# Patient Record
Sex: Female | Born: 1985
Health system: Southern US, Community
[De-identification: ages and names within clinical notes are randomized; demographics above are authoritative.]

## PROBLEM LIST (undated history)

## (undated) DIAGNOSIS — E079 Disorder of thyroid, unspecified: Secondary | ICD-10-CM

## (undated) DIAGNOSIS — N649 Disorder of breast, unspecified: Secondary | ICD-10-CM

## (undated) HISTORY — PX: BREAST SURGERY: SHX581

## (undated) HISTORY — DX: Disorder of breast, unspecified: N64.9

---

## 2006-09-23 ENCOUNTER — Emergency Department (HOSPITAL_COMMUNITY): Admission: EM | Admit: 2006-09-23 | Discharge: 2006-09-23 | Payer: Self-pay | Admitting: Emergency Medicine

## 2006-09-25 ENCOUNTER — Emergency Department (HOSPITAL_COMMUNITY): Admission: EM | Admit: 2006-09-25 | Discharge: 2006-09-25 | Payer: Self-pay | Admitting: Emergency Medicine

## 2009-01-03 ENCOUNTER — Emergency Department (HOSPITAL_COMMUNITY): Admission: EM | Admit: 2009-01-03 | Discharge: 2009-01-03 | Payer: Self-pay | Admitting: Emergency Medicine

## 2009-01-06 ENCOUNTER — Emergency Department (HOSPITAL_COMMUNITY): Admission: EM | Admit: 2009-01-06 | Discharge: 2009-01-06 | Payer: Self-pay | Admitting: Family Medicine

## 2009-11-20 ENCOUNTER — Emergency Department (HOSPITAL_COMMUNITY): Admission: EM | Admit: 2009-11-20 | Discharge: 2009-11-21 | Payer: Self-pay | Admitting: Emergency Medicine

## 2010-05-04 ENCOUNTER — Emergency Department (HOSPITAL_COMMUNITY): Admission: EM | Admit: 2010-05-04 | Discharge: 2010-05-04 | Payer: Self-pay | Admitting: Emergency Medicine

## 2010-05-08 ENCOUNTER — Emergency Department (HOSPITAL_COMMUNITY): Admission: EM | Admit: 2010-05-08 | Discharge: 2010-05-08 | Payer: Self-pay | Admitting: Emergency Medicine

## 2010-10-12 ENCOUNTER — Emergency Department (HOSPITAL_COMMUNITY)
Admission: EM | Admit: 2010-10-12 | Discharge: 2010-10-13 | Payer: Self-pay | Source: Home / Self Care | Admitting: Emergency Medicine

## 2010-10-15 ENCOUNTER — Emergency Department (HOSPITAL_COMMUNITY)
Admission: EM | Admit: 2010-10-15 | Discharge: 2010-10-15 | Payer: Self-pay | Source: Home / Self Care | Admitting: Emergency Medicine

## 2012-03-10 ENCOUNTER — Ambulatory Visit (INDEPENDENT_AMBULATORY_CARE_PROVIDER_SITE_OTHER): Payer: Self-pay | Admitting: *Deleted

## 2012-03-10 ENCOUNTER — Other Ambulatory Visit: Payer: Self-pay | Admitting: Obstetrics and Gynecology

## 2012-03-10 VITALS — BP 129/87 | HR 89 | Temp 99.1°F | Ht 60.0 in | Wt 140.7 lb

## 2012-03-10 DIAGNOSIS — Z1239 Encounter for other screening for malignant neoplasm of breast: Secondary | ICD-10-CM

## 2012-03-10 DIAGNOSIS — N6310 Unspecified lump in the right breast, unspecified quadrant: Secondary | ICD-10-CM

## 2012-03-10 DIAGNOSIS — N63 Unspecified lump in unspecified breast: Secondary | ICD-10-CM

## 2012-03-10 NOTE — Patient Instructions (Signed)
Taught patient how to perform BSE. Patient did not need a Pap smear today due to last Pap smear was 02/07/12. Let her know BCCCP will cover Pap smears every 3 years unless has a history of abnormal Pap smears. Patient referred to the [facility] for right breast ultrasound. Appointment scheduled for Thursday, Mar 12, 2012 at 0930. Patient aware of appointment and will be there. Let patient know will follow up with her within the next couple weeks with results. Patient verbalized understanding.

## 2012-03-10 NOTE — Progress Notes (Signed)
Complaints of lump in right breast. Patient referred from the [facility] Department.  Pap Smear:    Pap smear not performed today. Patients last Pap smear was 02/07/12 at the [facility] Department and was normal showing fungus. Per patient she has no history of abnormal Pap smears. No Pap smear results in EPIC.   Physical exam: Breasts Breasts symmetrical. No skin abnormalities bilateral breasts. No nipple retraction bilateral breasts. No nipple discharge bilateral breasts. No lymphadenopathy. No lumps palpated left breast. Palpated lump in the right breast around around 12:30 o'clock 8 cm from the areola. No complaints of pain or tenderness on exam. Patient referred to the [facility] for right breast ultrasound. Appointment scheduled for Thursday, Mar 12, 2012 at 0930.     Pelvic/Bimanual No Pap smear completed today since last Pap smear was 02/07/12. Pap smear not indicated per BCCCP guidelines.

## 2012-03-12 ENCOUNTER — Other Ambulatory Visit: Payer: Self-pay | Admitting: Obstetrics and Gynecology

## 2012-03-12 ENCOUNTER — Ambulatory Visit
Admission: RE | Admit: 2012-03-12 | Discharge: 2012-03-12 | Disposition: A | Discharge status: Home / Self Care | Payer: No Typology Code available for payment source | Source: Ambulatory Visit | Attending: Obstetrics and Gynecology | Admitting: Obstetrics and Gynecology

## 2012-03-12 DIAGNOSIS — N63 Unspecified lump in unspecified breast: Secondary | ICD-10-CM

## 2012-03-12 DIAGNOSIS — N6310 Unspecified lump in the right breast, unspecified quadrant: Secondary | ICD-10-CM

## 2012-03-16 ENCOUNTER — Ambulatory Visit
Admission: RE | Admit: 2012-03-16 | Discharge: 2012-03-16 | Disposition: A | Discharge status: Home / Self Care | Payer: No Typology Code available for payment source | Source: Ambulatory Visit | Attending: Obstetrics and Gynecology | Admitting: Obstetrics and Gynecology

## 2012-03-16 ENCOUNTER — Other Ambulatory Visit: Payer: Self-pay | Admitting: Obstetrics and Gynecology

## 2012-03-16 DIAGNOSIS — N6310 Unspecified lump in the right breast, unspecified quadrant: Secondary | ICD-10-CM

## 2012-03-27 ENCOUNTER — Telehealth: Payer: Self-pay | Admitting: *Deleted

## 2012-03-27 NOTE — Telephone Encounter (Signed)
Telephone patient at home number left message to return call [phone].

## 2012-04-11 ENCOUNTER — Inpatient Hospital Stay (HOSPITAL_COMMUNITY)
Admission: EM | Admit: 2012-04-11 | Discharge: 2012-04-13 | DRG: 897 | Disposition: A | Discharge status: Home / Self Care | Payer: Self-pay | Attending: Internal Medicine | Admitting: Internal Medicine

## 2012-04-11 DIAGNOSIS — E876 Hypokalemia: Secondary | ICD-10-CM | POA: Diagnosis present

## 2012-04-11 DIAGNOSIS — F17200 Nicotine dependence, unspecified, uncomplicated: Secondary | ICD-10-CM | POA: Diagnosis present

## 2012-04-11 DIAGNOSIS — F10929 Alcohol use, unspecified with intoxication, unspecified: Secondary | ICD-10-CM

## 2012-04-11 DIAGNOSIS — F1010 Alcohol abuse, uncomplicated: Principal | ICD-10-CM | POA: Diagnosis present

## 2012-04-11 DIAGNOSIS — R4182 Altered mental status, unspecified: Secondary | ICD-10-CM | POA: Diagnosis present

## 2012-04-11 LAB — URINE MICROSCOPIC-ADD ON

## 2012-04-11 LAB — CBC
Hemoglobin: 10.9 g/dL — ABNORMAL LOW (ref 12.0–15.0)
MCH: 35 pg — ABNORMAL HIGH (ref 26.0–34.0)
MCV: 104.2 fL — ABNORMAL HIGH (ref 78.0–100.0)
RBC: 3.11 MIL/uL — ABNORMAL LOW (ref 3.87–5.11)
WBC: 5.6 10*3/uL (ref 4.0–10.5)

## 2012-04-11 LAB — COMPREHENSIVE METABOLIC PANEL
ALT: 7 U/L (ref 0–35)
Alkaline Phosphatase: 41 U/L (ref 39–117)
Calcium: 8.1 mg/dL — ABNORMAL LOW (ref 8.4–10.5)
GFR calc Af Amer: >90 mL/min (ref >90)
GFR calc non Af Amer: >90 mL/min (ref >90)
Glucose, Bld: 74 mg/dL (ref 70–99)
Potassium: 3.2 mEq/L — ABNORMAL LOW (ref 3.5–5.1)
Sodium: 142 mEq/L (ref 135–145)
Total Protein: 6.5 g/dL (ref 6.0–8.3)

## 2012-04-11 LAB — ACETAMINOPHEN LEVEL: Acetaminophen (Tylenol), Serum: <15 ug/mL (ref 10–30)

## 2012-04-11 LAB — URINALYSIS, ROUTINE W REFLEX MICROSCOPIC
Bilirubin Urine: NEGATIVE
Ketones, ur: NEGATIVE mg/dL
Nitrite: NEGATIVE
Protein, ur: NEGATIVE mg/dL
Urobilinogen, UA: 0.2 mg/dL (ref 0.0–1.0)
pH: 7 (ref 5.0–8.0)

## 2012-04-11 LAB — RAPID URINE DRUG SCREEN, HOSP PERFORMED
Amphetamines: NOT DETECTED
Barbiturates: NOT DETECTED
Opiates: NOT DETECTED

## 2012-04-11 LAB — ETHANOL: Alcohol, Ethyl (B): 190 mg/dL — ABNORMAL HIGH (ref 0–11)

## 2012-04-11 LAB — SALICYLATE LEVEL: Salicylate Lvl: <2 mg/dL — ABNORMAL LOW (ref 2.8–20.0)

## 2012-04-11 LAB — GLUCOSE, CAPILLARY

## 2012-04-11 MED ORDER — NALOXONE HCL 1 MG/ML IJ SOLN
INTRAMUSCULAR | Status: AC
  Administered 2012-04-11: 4 mg
  Filled 2012-04-11: qty 6

## 2012-04-11 MED ORDER — ONDANSETRON HCL 4 MG/2ML IJ SOLN
4.0000 mg | Freq: Once | INTRAMUSCULAR | Status: AC
  Administered 2012-04-11: 4 mg via INTRAVENOUS
  Filled 2012-04-11: qty 2

## 2012-04-11 MED ORDER — SODIUM CHLORIDE 0.9 % IV SOLN
INTRAVENOUS | Status: DC
  Administered 2012-04-11: 23:00:00 via INTRAVENOUS
  Administered 2012-04-12: 125 mL/h via INTRAVENOUS

## 2012-04-11 NOTE — ED Notes (Signed)
EKG printed and given to EDP Dierdre Highman for review. No previous available.

## 2012-04-11 NOTE — ED Notes (Signed)
Pt with binge drinking (at least 4 40 oz beers) and "she took some Vic[place]" unknown qty. Pt was unresponsive on EMS arrival. Pt had previously vomited prior to loss of consciousness. Had nasal trumpet placed, placed on NRB, given total of 4mg  of Narcan and responded aggressively. Pt on arrival to ED appears to be sleeping, on painful stimuli, pt awakes and bites and strikes staff. Vitals: 108/53, hr  142, spo2 100% on RA, resp 14. PIV in left hand.

## 2012-04-11 NOTE — ED Notes (Signed)
ZOX:WRUE<AV> Expected date:<BR> Expected time:<BR> Means of arrival:<BR> Comments:<BR> EMS 212 GC, 25 yof OD. Unknown substance/opiate, responsive to Narcan. hypotension

## 2012-04-12 ENCOUNTER — Emergency Department (HOSPITAL_COMMUNITY): Payer: Self-pay

## 2012-04-12 DIAGNOSIS — R4182 Altered mental status, unspecified: Secondary | ICD-10-CM

## 2012-04-12 DIAGNOSIS — F1010 Alcohol abuse, uncomplicated: Secondary | ICD-10-CM

## 2012-04-12 LAB — CARDIAC PANEL(CRET KIN+CKTOT+MB+TROPI)
CK, MB: 1.4 ng/mL (ref 0.3–4.0)
Relative Index: 1.4 (ref 0.0–2.5)
Troponin I: <0.3 ng/mL (ref <0.30)

## 2012-04-12 LAB — ACETAMINOPHEN LEVEL: Acetaminophen (Tylenol), Serum: <15 ug/mL (ref 10–30)

## 2012-04-12 LAB — MAGNESIUM: Magnesium: 1.8 mg/dL (ref 1.5–2.5)

## 2012-04-12 LAB — TSH: TSH: 1.085 u[IU]/mL (ref 0.350–4.500)

## 2012-04-12 LAB — PHOSPHORUS: Phosphorus: 3.3 mg/dL (ref 2.3–4.6)

## 2012-04-12 LAB — GLUCOSE, CAPILLARY: Glucose-Capillary: 131 mg/dL — ABNORMAL HIGH (ref 70–99)

## 2012-04-12 MED ORDER — SODIUM CHLORIDE 0.9 % IV SOLN: INTRAVENOUS | Status: DC

## 2012-04-12 MED ORDER — POTASSIUM CHLORIDE 10 MEQ/100ML IV SOLN
10.0000 meq | INTRAVENOUS | Status: AC
  Administered 2012-04-12 (×3): 10 meq via INTRAVENOUS
  Filled 2012-04-12 (×4): qty 100

## 2012-04-12 MED ORDER — DEXTROSE 50 % IV SOLN
50.0000 mL | Freq: Once | INTRAVENOUS | Status: AC
  Administered 2012-04-12: 50 mL via INTRAVENOUS
  Filled 2012-04-12: qty 50

## 2012-04-12 MED ORDER — SODIUM CHLORIDE 0.9 % IJ SOLN
3.0000 mL | Freq: Two times a day (BID) | INTRAMUSCULAR | Status: DC
  Administered 2012-04-12: 3 mL via INTRAVENOUS

## 2012-04-12 MED ORDER — SODIUM CHLORIDE 0.9 % IV BOLUS (SEPSIS)
1000.0000 mL | Freq: Once | INTRAVENOUS | Status: AC
  Administered 2012-04-12: 1000 mL via INTRAVENOUS

## 2012-04-12 MED ORDER — ENOXAPARIN SODIUM 40 MG/0.4ML ~~LOC~~ SOLN
40.0000 mg | SUBCUTANEOUS | Status: DC
  Administered 2012-04-12: 40 mg via SUBCUTANEOUS
  Filled 2012-04-12 (×2): qty 0.4

## 2012-04-12 NOTE — ED Notes (Signed)
Psych Specialist On call online and was ready to evaluate pt. But pt. Was  unable to stay awake for the consult. Specialist MD will call back in 2 hours as this Nurse was advised. Pt. Asleep most of the time and responded to calling. Kept monitored for any s/s of distress; none noted.V/S within normal limits. Will continue to monitor.

## 2012-04-12 NOTE — ED Provider Notes (Addendum)
Patient is still very somnolent. Repeat alcohol level is 45 and she should not be somnolent based on an alcohol that is at low. Repeat acetaminophen level is nondetectable making it very unlikely that she had taken any Vicodin. Psychiatric consultation will be obtained as soon as she is awake enough to be interviewed.  Dione Booze, MD 04/12/12 [phone]  She is still somnolent although did develop transient tachycardia. CT scan will be obtained to make sure there is not another cause for her altered level of consciousness  Dione Booze, MD 04/12/12 [phone]  CT is negative. Patient still has not woken up. No obvious cause for ongoing decreased mental status, although it may not be a conversion disorder. She will need hospital admission for observation.  Dione Booze, MD 04/12/12 1121  Case is discussed with Dr. Verlon Setting who agrees to admit the patient.  Dione Booze, MD 04/12/12 1141

## 2012-04-12 NOTE — ED Provider Notes (Signed)
History     CSN: [id number]  Arrival date & time 04/11/12  2233   First MD Initiated Contact with Patient 04/11/12 2316      Chief Complaint  Patient presents with  . Ingestion  . Alcohol Intoxication    (Consider location/radiation/quality/duration/timing/severity/associated sxs/prior treatment) HPI History provided by EMS. Drinking alcohol tonight and took "some Vicodin". Per EMS had vomiting and LOC and required nasal trumpet. She was given Narcan and responded very aggressively. On Arrival to emergency department she responds to painful stimuli provides very limited history. She denies any suicidal ideation. She'll wake up and have brief outbursts cannot provide any further history. Level V caveat applies Past Medical History  Diagnosis Date  . Breast disorder     lump in right breast/inflammation both breast    Past Surgical History  Procedure Date  . Cesarean section     Family History  Problem Relation Age of Onset  . Diabetes Paternal Grandmother   . Hypertension Paternal Grandmother   . Breast cancer Maternal Grandmother     History  Substance Use Topics  . Smoking status: Current Everyday Smoker -- 0.2 packs/day for 3 years  . Smokeless tobacco: Never Used  . Alcohol Use: 1.8 oz/week    3 Cans of beer per week    OB History    Grav Para Term Preterm Abortions TAB SAB Ect Mult Living   1 1 1       1       Review of Systems Unable to obtain. Level V caveat as above. Allergies  Review of patient's allergies indicates no known allergies.  Home Medications  No current outpatient prescriptions on file.  BP 125/80  Pulse 106  Temp 97.1 F (36.2 C) (Axillary)  Resp 15  SpO2 99%  Physical Exam  Constitutional: She appears well-developed and well-nourished.  HENT:  Head: Normocephalic and atraumatic.  Right Ear: External ear normal.  Left Ear: External ear normal.  Nose: Nose normal.  Mouth/Throat: Oropharynx is clear and moist.  Eyes: Pupils  are equal, round, and reactive to light.  Neck: Neck supple. No tracheal deviation present. No thyromegaly present.  Cardiovascular: Regular rhythm.        Mild tachycardia  Pulmonary/Chest: Effort normal and breath sounds normal. No stridor. No respiratory distress.       Protecting her airway  Abdominal: Soft. Bowel sounds are normal. There is no tenderness.  Musculoskeletal: She exhibits no edema and no tenderness.  Neurological:       Responds to painful stimuli. Moves all extremities without obvious deficits. Answers limited questions.  Skin: Skin is warm and dry. No rash noted.    ED Course  Procedures (including critical care time)  Labs Reviewed  CBC - Abnormal; Notable for the following:    RBC 3.11 (*)     Hemoglobin 10.9 (*)     HCT 32.4 (*)     MCV 104.2 (*)     MCH 35.0 (*)     All other components within normal limits  COMPREHENSIVE METABOLIC PANEL - Abnormal; Notable for the following:    Potassium 3.2 (*)     Calcium 8.1 (*)     Albumin 3.4 (*)     Total Bilirubin 0.2 (*)     All other components within normal limits  ETHANOL - Abnormal; Notable for the following:    Alcohol, Ethyl (B) 190 (*)     All other components within normal limits  SALICYLATE LEVEL - Abnormal; Notable  for the following:    Salicylate Lvl <2.0 (*)     All other components within normal limits  URINALYSIS, ROUTINE W REFLEX MICROSCOPIC - Abnormal; Notable for the following:    Hgb urine dipstick LARGE (*)     All other components within normal limits  GLUCOSE, CAPILLARY - Abnormal; Notable for the following:    Glucose-Capillary 69 (*)     All other components within normal limits  GLUCOSE, CAPILLARY - Abnormal; Notable for the following:    Glucose-Capillary 131 (*)     All other components within normal limits  ACETAMINOPHEN LEVEL  URINE RAPID DRUG SCREEN (HOSP PERFORMED)  POCT PREGNANCY, URINE  URINE MICROSCOPIC-ADD ON    Date: 04/12/2012  Rate: 120  Rhythm: sinus  tachycardia  QRS Axis: normal  Intervals: normal  ST/T Wave abnormalities: nonspecific ST changes  Conduction Disutrbances:none  Narrative Interpretation:   Old EKG Reviewed: none available  IV fluids and serial evaluations unchanged.ACT aware and plan is wait for PT to sober and will require telepsych consult  Recheck at 7 AM unchanged. EtOH level noted. Poison control has been notified.    MDM   Alcohol intoxication. Possible polysubstances.  Labs, UA and UDS reviewed as above. Nursing notes reviewed. Limited old records reviewed does not have known psych history. Repeat Tylenol level sent at 7 AM. Telemetry psych consult pending.        Sunnie Nielsen, MD 04/19/12 [phone]

## 2012-04-12 NOTE — ED Notes (Signed)
Ms. Revonda Standard of Poison control dept. Updated of pt.'s lab results and latest condition. No further recommendations at this time. Pt. Is asleep most of the time but responsive to calling, denied of any pain.

## 2012-04-12 NOTE — H&P (Signed)
[facility] History and Physical  Amy Hanson BJY:[id number] DOB: 21-Oct-1986 DOA: 04/11/2012  Referring physician: Dr. Preston Fleeting, Emergency Department  PCP: No primary provider on file.   Chief Complaint: Altered mental status  HPI:  Please note that this history was obtained from EMS mostly as pt's mental status is altered and she is unable to provide history. Pt is 26 yo female who was brought by EMS secondary to ? Drug overdose in addition to alcohol intoxication. She reportedly took Vicodin and was drinking but the details are not clear.   Review of Systems: unable to obtain given altered mental status  Past Medical History  Diagnosis Date  . Breast disorder     lump in right breast/inflammation both breast   Past Surgical History  Procedure Date  . Cesarean section    Social History:  reports that she has been smoking.  She has never used smokeless tobacco. She reports that she drinks about 1.8 ounces of alcohol per week. She reports that she does not use illicit drugs.  No Known Allergies  Family History  Problem Relation Age of Onset  . Diabetes Paternal Grandmother   . Hypertension Paternal Grandmother   . Breast cancer Maternal Grandmother     Prior to Admission medications   Medication Sig Start Date End Date Taking? Authorizing Provider  Hydrocodone-Acetaminophen (VICODIN PO) Take by mouth. Call pharmacy in the am to clarify dosage   Yes Historical Provider, MD   Physical Exam: Filed Vitals:   04/11/12 2345 04/12/12 0303 04/12/12 0605 04/12/12 1217  BP: 125/80 120/71 124/77 136/89  Pulse: 106 110 107 129  Temp:  98.6 F (37 C)  98.6 F (37 C)  TempSrc:  Oral  Oral  Resp: 15 17 16 16   SpO2: 99% 99% 100% 98%     General:  Pt somnolent, does not follow commands  Eyes: PERRLA, no scleral icterus  Neck: Supple  Cardiovascular: Regular rhythm but tachycardic, no murmurs  Respiratory: Clear to auscultation bilaterally, no respiratory distress, no  wheezing and no crackles  Abdomen: Soft, non tender and non distended, bowel sounds present  Skin: Good turgor  Neurologic: Pt somnolent and not following commands, moving all 4 extremities spontaneously and there are no gross focal deficits   Labs on Admission:  Basic Metabolic Panel:  Lab 04/11/12 0865  NA 142  K 3.2*  CL 107  CO2 21  GLUCOSE 74  BUN 12  CREATININE 0.72  CALCIUM 8.1*   Liver Function Tests: Lab 04/11/12 2300  AST 17  ALT 7  ALKPHOS 41  BILITOT 0.2*  PROT 6.5  ALBUMIN 3.4*   CBC: Lab 04/11/12 2300  WBC 5.6  HGB 10.9  HCT 32.4*  MCV 104.2*  PLT 244   CBG:  Lab 04/12/12 0107 04/11/12 2325  GLUCAP 131* 69*    Radiological Exams on Admission:  Ct Head Wo Contrast 04/12/2012    IMPRESSION:  Negative noncontrast head CT.     EKG: pending at this time  Assessment/Plan  Altered Mental Status - likely secondary to acute intoxication, alcohol - acetaminophen level has been within normal limits and thus not indicative of Vicodin overdose - LFT's stable and within normal limits except slightly low albumin - will admit the pt to telemetry floor for further evaluation and monitoring - will place on CIWA protocol - start IVF = psych consult and SW consult for possible [facility] placement  Hypokalemia - will supplement by IV route and will check Mg level - BMP  in AM  Alcohol abuse - will provide counseling once pt more alert   Code Status: Full Family Communication: None at this time Disposition Plan: Psych evaluation and may perhaps need [facility] placement for further evaluation  Debbora Presto, MD  [facility] Pager [phone]  If 7PM-7AM, please contact night-coverage [email or web address] Password [email or web address] 04/12/2012, 1:14 PM

## 2012-04-12 NOTE — ED Notes (Signed)
Writer observed pt to be sleeping and unable to coherently provide information for assessment. Pt's RN reported to Clinical research associate that pt is scheduled to receive CT scan to ensure no other cause for her altered level of consciousness. Writer requested pt's RN to Lexicographer once pt is alert to complete assessment.   Janann Colonel., MSW, [facility] Clinical Social Worker [phone]

## 2012-04-13 ENCOUNTER — Encounter (HOSPITAL_COMMUNITY): Payer: Self-pay

## 2012-04-13 DIAGNOSIS — R4182 Altered mental status, unspecified: Secondary | ICD-10-CM

## 2012-04-13 DIAGNOSIS — F1010 Alcohol abuse, uncomplicated: Secondary | ICD-10-CM

## 2012-04-13 LAB — HEMOGLOBIN A1C
Hgb A1c MFr Bld: 4.5 % (ref <5.7)
Mean Plasma Glucose: 82 mg/dL (ref <117)

## 2012-04-13 LAB — CBC
MCV: 105.3 fL — ABNORMAL HIGH (ref 78.0–100.0)
Platelets: 261 10*3/uL (ref 150–400)
RDW: 11.8 % (ref 11.5–15.5)
WBC: 9.4 10*3/uL (ref 4.0–10.5)

## 2012-04-13 LAB — CARDIAC PANEL(CRET KIN+CKTOT+MB+TROPI)
CK, MB: 1 ng/mL (ref 0.3–4.0)
Total CK: 86 U/L (ref 7–177)
Troponin I: <0.3 ng/mL (ref <0.30)

## 2012-04-13 LAB — BASIC METABOLIC PANEL
Calcium: 8.9 mg/dL (ref 8.4–10.5)
Creatinine, Ser: 0.62 mg/dL (ref 0.50–1.10)
GFR calc Af Amer: >90 mL/min (ref >90)
GFR calc non Af Amer: >90 mL/min (ref >90)

## 2012-04-13 MED ORDER — THIAMINE HCL 100 MG/ML IJ SOLN
100.0000 mg | Freq: Every day | INTRAMUSCULAR | Status: DC
  Filled 2012-04-13: qty 1

## 2012-04-13 MED ORDER — LORAZEPAM 2 MG/ML IJ SOLN: 0.0000 mg | Freq: Four times a day (QID) | INTRAMUSCULAR | Status: DC

## 2012-04-13 MED ORDER — TRAMADOL HCL 50 MG PO TABS: 50.0000 mg | ORAL_TABLET | Freq: Four times a day (QID) | ORAL | Status: AC | PRN

## 2012-04-13 MED ORDER — ADULT MULTIVITAMIN W/MINERALS CH
1.0000 | ORAL_TABLET | Freq: Every day | ORAL | Status: DC
  Administered 2012-04-13: 1 via ORAL
  Filled 2012-04-13: qty 1

## 2012-04-13 MED ORDER — FOLIC ACID 1 MG PO TABS
1.0000 mg | ORAL_TABLET | Freq: Every day | ORAL | Status: DC
  Administered 2012-04-13: 1 mg via ORAL
  Filled 2012-04-13: qty 1

## 2012-04-13 MED ORDER — LORAZEPAM 1 MG PO TABS: 1.0000 mg | ORAL_TABLET | Freq: Four times a day (QID) | ORAL | Status: DC | PRN

## 2012-04-13 MED ORDER — LORAZEPAM 2 MG/ML IJ SOLN: 1.0000 mg | Freq: Four times a day (QID) | INTRAMUSCULAR | Status: DC | PRN

## 2012-04-13 MED ORDER — VITAMIN B-1 100 MG PO TABS
100.0000 mg | ORAL_TABLET | Freq: Every day | ORAL | Status: DC
  Administered 2012-04-13: 100 mg via ORAL
  Filled 2012-04-13: qty 1

## 2012-04-13 MED ORDER — HYDROCODONE-ACETAMINOPHEN 5-325 MG PO TABS
1.0000 | ORAL_TABLET | Freq: Once | ORAL | Status: AC
  Administered 2012-04-13: 1 via ORAL
  Filled 2012-04-13: qty 1

## 2012-04-13 MED ORDER — LORAZEPAM 2 MG/ML IJ SOLN: 0.0000 mg | Freq: Two times a day (BID) | INTRAMUSCULAR | Status: DC

## 2012-04-13 NOTE — Discharge Summary (Signed)
Physician Discharge Summary  Amy Hanson ION:[id number] DOB: [birth year] DOA: 04/11/2012  PCP: No primary provider on file.  Admit date: 04/11/2012 Discharge date: 04/13/2012  Recommendations for Outpatient Follow-up:  1. Pt agreed to follow up with psychologist in an outpatient setting as needed or psychiatrist. She denies any SI, and denies any prior history of this as well. Discussed the need to call 911 if SI, and pt verbalized understanding.  Discharge Diagnoses:  Altered mental status, secondary to alcohol intoxication and use of benadryl  Discharge Condition: stable  Diet recommendation: none  History of present illness:  Please note that this history was obtained from EMS mostly as pt's mental status is altered and she is unable to provide history.  Pt is 26 yo female who was brought by EMS secondary to ? Drug overdose in addition to alcohol intoxication. She reportedly took Vicodin and was drinking but the details are not clear.   Hospital Course:   Change in mental status  - secondary to alcohol intoxication and use of benadryl - pt reports this was unintentional and only wanted to get some rest but did not know this was dangerous combination - she reports wanting to be discharged as she needs to go to her new job - family and husband present at bedside and all verbalized understanding - alcohol cessation discussed over 1 hour in duration  Procedures:  none  Consultations:  none  Discharge Exam: Filed Vitals:   04/13/12 1025  BP:   Pulse: 109  Temp:   Resp:    Filed Vitals:   04/12/12 2216 04/13/12 0526 04/13/12 1000 04/13/12 1025  BP: 130/92 127/86 132/84   Pulse: 101 101 180 109  Temp: 98.8 F (37.1 C) 98.3 F (36.8 C)    TempSrc: Axillary Axillary    Resp: 24 22 18    Weight:      SpO2: 98% 100% 100%     General: Pt is alert, follows commands appropriately, not in acute distress Cardiovascular: Regular rhythm, tachycardic, S1/S2 +, no murmurs, no  rubs, no gallops Respiratory: Clear to auscultation bilaterally, no wheezing, no crackles, no rhonchi Abdominal: Soft, non tender, non distended, bowel sounds +, no guarding Extremities: no edema, no cyanosis, pulses palpable bilaterally DP and PT Neuro: Grossly nonfocal  Discharge Instructions  Discharge Orders    Future Orders Please Complete By Expires   Diet - low sodium heart healthy      Increase activity slowly        Medication List  As of 04/13/2012 12:14 PM   TAKE these medications         ibuprofen 200 MG tablet   Commonly known as: ADVIL,MOTRIN   Take 400 mg by mouth every 6 (six) hours as needed. pain      traMADol 50 MG tablet   Commonly known as: ULTRAM   Take 1 tablet (50 mg total) by mouth every 6 (six) hours as needed for pain.           Follow-up Information    Please follow up. (As needed if symptoms worsen)           The results of significant diagnostics from this hospitalization (including imaging, microbiology, ancillary and laboratory) are listed below for reference.    Significant Diagnostic Studies: Ct Head Wo Contrast  04/12/2012  *RADIOLOGY REPORT*  Clinical Data: Altered mental status  CT HEAD WITHOUT CONTRAST  Technique:  Contiguous axial images were obtained from the base of the skull through the vertex  without contrast.  Comparison: None.  Findings: Wallace Cullens white differentiation is maintained.  No CT evidence of acute large territory infarct.  No intraparenchymal or extra- axial hemorrhage.  No intraparenchymal mass.  Exuberant calcification about the left lateral aspect of the anterior midline falx is without associated vasogenic edema.  Normal size configuration of the ventricles and basilar cisterns.  No midline shift.  The paranasal sinuses and mastoid air cells are normal. Regional soft tissues are normal.  No displaced calvarial fracture.  IMPRESSION: Negative noncontrast head CT.  Original Report Authenticated By: Waynard Reeds, M.D.    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 This was found to be concordant by Dr. Cain Saupe. Pathology was relayed by telephone. The patient reported doing well after the biopsy. Post biopsy instructions were reviewed and her questions were answered. She was encouraged to call [facility] for any additional concerns. She was asked to continue with monthly self breast examinations and to return for screening mammography at age 78.  Pathology results are dictated by Sonnie Alamo RN, BSN on Mar 17, 2012.  **END ADDENDUM** SIGNED BY: Amy Hanson, M.D.   03/16/2012  *RADIOLOGY REPORT*  Clinical Data:  Right breast mass.  ULTRASOUND GUIDED CORE BIOPSY OF THE right BREAST  Comparison: Prior studies  I met with the patient and we discussed the procedure of ultrasound- guided biopsy, including benefits and alternatives.  We discussed the high likelihood of a successful procedure. We discussed the risks of the procedure, including infection, bleeding, tissue injury, clip migration, and inadequate sampling.  Informed written consent was given.  Using sterile technique 2% lidocaine, ultrasound guidance and a 14 gauge automated biopsy device, biopsy was performed of the palpable mass located within the right breast at the 12 o'clock position. At the conclusion of the procedure a ribbon shaped tissue marker clip was deployed into the biopsy cavity. Due to the patient's age, a post clip placement mammogram was not performed.  IMPRESSION: Ultrasound guided biopsy of the palpable right breast mass located at 12 o'clock position.  No apparent complications. Original Report Authenticated By: Amy Hanson, M.D.    Microbiology: No results found for this or any previous visit (from the past 240 hour(s)).   Labs: Basic Metabolic Panel:  Lab 04/13/12 1610 04/12/12 1320 04/11/12 2300  NA 135 -- 142   K 3.7 -- 3.2*  CL 103 -- 107  CO2 21 -- 21  GLUCOSE 76 -- 74  BUN 7 -- 12  CREATININE 0.62 -- 0.72  CALCIUM 8.9 -- 8.1*  MG -- 1.8 --  PHOS -- 3.3 --   Liver Function Tests:  Lab 04/11/12 2300  AST 17  ALT 7  ALKPHOS 41  BILITOT 0.2*  PROT 6.5  ALBUMIN 3.4*   CBC:  Lab 04/13/12 0520 04/11/12 2300  WBC 9.4 5.6  NEUTROABS -- --  HGB 11.3* 10.9*  HCT 33.9* 32.4*  MCV 105.3* 104.2*  PLT 261 244   Cardiac Enzymes:  Lab 04/13/12 0520 04/12/12 2047 04/12/12 1342  CKTOTAL 86 100 102  CKMB 1.0 1.4 1.5  CKMBINDEX -- -- --  TROPONINI <0.30 <0.30 <0.30   BNP: BNP (last 3 results) No results found for this basename: PROBNP:3 in the last 8760 hours CBG:  Lab 04/13/12 0738 04/12/12 0107 04/11/12 2325  GLUCAP 72 131* 69*    Time coordinating discharge: Over 30 minutes  Signed: 04/13/2012,  12:14 PM  Debbora Presto, MD  [facility] Pager [phone]  If 7PM-7AM, please contact night-coverage [email or web address] Password [email or web address]

## 2012-04-13 NOTE — Discharge Instructions (Signed)
Alcohol Intoxication  You have alcohol intoxication when the amount of alcohol that you have consumed has impaired your ability to mentally and physically function. There are a variety of factors that contribute to the level at which alcohol intoxication can occur, such as age, gender, weight, frequency of alcohol consumption, medication use, and the presence of other medical conditions, such as diabetes, seizures, or heart conditions.  The blood alcohol level test measures the concentration of alcohol in your blood. In most states, your blood alcohol level must be lower than 80 mg/dL (0.08%) to legally drive. However, many dangerous effects of alcohol can occur at much lower levels.  Alcohol directly impairs the normal chemical activity of the brain and is said to be a chemical depressant. Alcohol can cause drowsiness, stupor, respiratory failure, and coma. Other physical effects can include headache, vomiting, vomiting of blood, abdominal pain, a fast heartbeat, difficulty breathing, anxiety, and amnesia. Alcohol intoxication can also lead to dangerous and life-threatening activities, such as fighting, dangerous operation of vehicles or heavy machinery, and risky sexual behavior.  Alcohol can be especially dangerous when taken with other drugs. Some of these drugs are:   Sedatives.   Painkillers.   Marijuana.   Tranquilizers.   Antihistamines.   Muscle relaxants.   Seizure medicine.  Many of the effects of acute alcohol intoxication are temporary. However, repeated alcohol intoxication can lead to severe medical illnesses. If you have alcohol intoxication, you should:   Stay hydrated. Drink enough water and fluids to keep your urine clear or pale yellow. Avoid excessive caffeine because this can further lead to dehydration.   Eat a healthy diet. You may have residual nausea, headache, and loss of appetite, but it is still important that you maintain good nutrition. You can start with clear  liquids.   Take nonsteroidal anti-inflammatory medications as needed for headaches, but make sure to do so with small meals. You should avoid acetaminophen for several days after having alcohol intoxication because the combination of alcohol and acetaminophen can be toxic to your liver.  If you have frequent alcohol intoxication, ask your friends and family if they think you have a drinking problem. For further help, contact:   Your caregiver.   Alcoholics Anonymous (AA).   A drug or alcohol rehabilitation program.  SEEK MEDICAL CARE IF:    You have persistent vomiting.   You have persistent pain in any part of your body.   You do not feel better after a few days.  SEEK IMMEDIATE MEDICAL CARE IF:    You become shaky or tremble when you try to stop drinking.   You shake uncontrollably (seizure).   You throw up (vomit) blood. This may be bright red or it may look like black coffee grounds.   You have blood in the stool. This may be bright red or appear as a black, tarry, bad smelling stool.   You become lightheaded or faint.  ANY OF THESE SYMPTOMS MAY REPRESENT A SERIOUS PROBLEM THAT IS AN EMERGENCY. Do not wait to see if the symptoms will go away. Get medical help right away. Call your local emergency services (911 in [place] DO NOT drive yourself to the hospital.  MAKE SURE YOU:    Understand these instructions.   Will watch your condition.   Will get help right away if you are not doing well or get worse.  Document Released: 07/24/2005 Document Revised: 10/03/2011 Document Reviewed: 04/02/2010  [employer] Patient Information 2012 [employer].

## 2014-08-29 ENCOUNTER — Encounter (HOSPITAL_COMMUNITY): Payer: Self-pay

## 2016-11-29 ENCOUNTER — Encounter (HOSPITAL_BASED_OUTPATIENT_CLINIC_OR_DEPARTMENT_OTHER): Payer: Self-pay | Admitting: *Deleted

## 2016-11-29 ENCOUNTER — Emergency Department (HOSPITAL_BASED_OUTPATIENT_CLINIC_OR_DEPARTMENT_OTHER): Payer: No Typology Code available for payment source

## 2016-11-29 ENCOUNTER — Emergency Department (HOSPITAL_BASED_OUTPATIENT_CLINIC_OR_DEPARTMENT_OTHER)
Admission: EM | Admit: 2016-11-29 | Discharge: 2016-11-29 | Disposition: A | Discharge status: Home / Self Care | Payer: No Typology Code available for payment source | Attending: Emergency Medicine | Admitting: Emergency Medicine

## 2016-11-29 DIAGNOSIS — W1839XA Other fall on same level, initial encounter: Secondary | ICD-10-CM | POA: Diagnosis not present

## 2016-11-29 DIAGNOSIS — Z87891 Personal history of nicotine dependence: Secondary | ICD-10-CM | POA: Insufficient documentation

## 2016-11-29 DIAGNOSIS — S99912A Unspecified injury of left ankle, initial encounter: Secondary | ICD-10-CM | POA: Diagnosis present

## 2016-11-29 DIAGNOSIS — Y929 Unspecified place or not applicable: Secondary | ICD-10-CM | POA: Insufficient documentation

## 2016-11-29 DIAGNOSIS — Y999 Unspecified external cause status: Secondary | ICD-10-CM | POA: Insufficient documentation

## 2016-11-29 DIAGNOSIS — S93402A Sprain of unspecified ligament of left ankle, initial encounter: Secondary | ICD-10-CM | POA: Insufficient documentation

## 2016-11-29 DIAGNOSIS — Y939 Activity, unspecified: Secondary | ICD-10-CM | POA: Insufficient documentation

## 2016-11-29 MED ORDER — IBUPROFEN 800 MG PO TABS
800.0000 mg | ORAL_TABLET | Freq: Once | ORAL | Status: AC
  Administered 2016-11-29: 800 mg via ORAL
  Filled 2016-11-29: qty 1

## 2016-11-29 MED ORDER — NAPROXEN 500 MG PO TABS: 500.0000 mg | ORAL_TABLET | Freq: Two times a day (BID) | ORAL | 0 refills | Status: AC

## 2016-11-29 MED ORDER — IBUPROFEN 800 MG PO TABS
ORAL_TABLET | ORAL | Status: AC
  Filled 2016-11-29: qty 1

## 2016-11-29 MED FILL — NAPROXEN 500 MG TABLET: 500 | 7 days supply | Qty: 14 | Fill #0

## 2016-11-29 NOTE — Discharge Instructions (Signed)
Weight-bear on the left foot when tolerated walk on the left foot when tolerated. Make an appointment to follow-up with sports medicine upstairs. Use the crutches as needed. Use the ASO wrap at all times. Take the Naprosyn every 12 hours as directed. Elevate the left foot is much as possible for the next 2 days.

## 2016-11-29 NOTE — ED Triage Notes (Signed)
Left ankle pain since falling last night.  States that it is her ankle, pt tender on palpation of the dorsal foot.  No bruising, swelling noted.  CMS intact. Pulse 2+

## 2016-11-29 NOTE — ED Provider Notes (Signed)
[facility] Provider Note   CSN: [id number] Arrival date & time: 11/29/16  0727     History   Chief Complaint Chief Complaint  Patient presents with  . Ankle Pain    HPI Amy Hanson is a 31 y.o. female.  Patient with injury to the left foot and ankle. Patient stumbled in the dark. Not exactly sure what happened to the foot and ankle. But does experience pain and difficulty weightbearing since then. No other injuries or concerns. This happened last night.      Past Medical History:  Diagnosis Date  . Breast disorder    lump in right breast/inflammation both breast    There are no active problems to display for this patient.   Past Surgical History:  Procedure Laterality Date  . BREAST SURGERY     chip insertion that lets medical professional know that her breasts have been checked per pt  . CESAREAN SECTION      OB History    Gravida Para Term Preterm AB Living   1 1 1     1    SAB TAB Ectopic Multiple Live Births                   Home Medications    Prior to Admission medications   Medication Sig Start Date End Date Taking? Authorizing Provider  ibuprofen (ADVIL,MOTRIN) 200 MG tablet Take 400 mg by mouth every 6 (six) hours as needed. pain    Historical Provider, MD  naproxen (NAPROSYN) 500 MG tablet Take 1 tablet (500 mg total) by mouth 2 (two) times daily. 11/29/16   Vanetta Mulders, MD    Family History Family History  Problem Relation Age of Onset  . Diabetes Paternal Grandmother   . Hypertension Paternal Grandmother   . Breast cancer Maternal Grandmother     Social History Social History  Substance Use Topics  . Smoking status: Former Smoker    Packs/day: 0.25    Years: 3.00  . Smokeless tobacco: Never Used  . Alcohol use No     Allergies   Patient has no known allergies.   Review of Systems Review of Systems  Constitutional: Negative for fever.  HENT: Negative for congestion.   Respiratory: Negative for shortness of  breath.   Cardiovascular: Negative for chest pain.  Gastrointestinal: Negative for abdominal pain.  Genitourinary: Negative for dysuria.  Musculoskeletal: Negative for back pain and neck pain.  Skin: Negative for wound.  Neurological: Negative for syncope and headaches.  Hematological: Does not bruise/bleed easily.  Psychiatric/Behavioral: Negative for confusion.     Physical Exam Updated Vital Signs BP 114/68 (BP Location: Right Arm)   Pulse 98   Temp 98.3 F (36.8 C) (Oral)   Resp 18   Ht 4\' 11"  (1.499 m)   Wt 55.8 kg   LMP 11/05/2016   SpO2 100%   BMI 24.84 kg/m   Physical Exam  Constitutional: She is oriented to person, place, and time. She appears well-developed and well-nourished. No distress.  HENT:  Head: Normocephalic and atraumatic.  Eyes: Conjunctivae and EOM are normal. Pupils are equal, round, and reactive to light.  Neck: Normal range of motion. Neck supple.  Cardiovascular: Normal rate, regular rhythm and normal heart sounds.   Pulmonary/Chest: Effort normal and breath sounds normal. No respiratory distress.  Abdominal: Soft. Bowel sounds are normal. There is no tenderness.  Musculoskeletal: She exhibits tenderness.  Left foot with tenderness on the forefoot and ankle area no proximal fibular  tenderness. Dorsalis pedis pulses 2+. Sensation intact. No significant swelling.  Neurological: She is alert and oriented to person, place, and time. No cranial nerve deficit or sensory deficit. She exhibits normal muscle tone. Coordination normal.  Nursing note and vitals reviewed.    ED Treatments / Results  Labs (all labs ordered are listed, but only abnormal results are displayed) Labs Reviewed - No data to display  EKG  EKG Interpretation None       Radiology Dg Ankle Complete Left  Result Date: 11/29/2016 CLINICAL DATA:  Sprained LEFT ankle while hopping around at 0000 hrs, lateral and dorsal LEFT foot and ankle pain with swelling, cannot dorsiflex  EXAM: LEFT ANKLE COMPLETE - 3+ VIEW COMPARISON:  None FINDINGS: Osseous mineralization normal. Joint spaces preserved. No acute fracture, dislocation, or bone destruction. IMPRESSION: Normal exam. Electronically Signed   By: Ulyses SouthwardMark  Boles M.D.   On: 11/29/2016 08:23   Dg Foot Complete Left  Result Date: 11/29/2016 CLINICAL DATA:  Injury. EXAM: LEFT FOOT - COMPLETE 3+ VIEW COMPARISON:  No prior. FINDINGS: No acute bony or joint abnormality identified. No focal bony abnormality. IMPRESSION: No acute abnormality . Electronically Signed   By: Maisie Fushomas  Register   On: 11/29/2016 08:23    Procedures Procedures (including critical care time)  Medications Ordered in ED Medications  ibuprofen (ADVIL,MOTRIN) 800 MG tablet (not administered)  ibuprofen (ADVIL,MOTRIN) tablet 800 mg (800 mg Oral Given 11/29/16 0816)     Initial Impression / Assessment and Plan / ED Course  I have reviewed the triage vital signs and the nursing notes.  Pertinent labs & imaging results that were available during my care of the patient were reviewed by me and considered in my medical decision making (see chart for details).    X-rays negative for foot or ankle fracture. We'll treat as a sprain. With ASO wrap and crutches.  Final Clinical Impressions(s) / ED Diagnoses   Final diagnoses:  Sprain of left ankle, unspecified ligament, initial encounter    New Prescriptions New Prescriptions   NAPROXEN (NAPROSYN) 500 MG TABLET    Take 1 tablet (500 mg total) by mouth 2 (two) times daily.     Vanetta MuldersScott Emerson Barretto, MD 11/29/16 [id number]

## 2016-11-29 NOTE — ED Notes (Signed)
Ice applied

## 2017-02-18 ENCOUNTER — Encounter (INDEPENDENT_AMBULATORY_CARE_PROVIDER_SITE_OTHER): Payer: Self-pay

## 2017-02-18 ENCOUNTER — Encounter: Payer: Self-pay | Admitting: Podiatry

## 2017-02-18 ENCOUNTER — Ambulatory Visit (INDEPENDENT_AMBULATORY_CARE_PROVIDER_SITE_OTHER): Payer: No Typology Code available for payment source | Admitting: Podiatry

## 2017-02-18 DIAGNOSIS — M2142 Flat foot [pes planus] (acquired), left foot: Secondary | ICD-10-CM | POA: Diagnosis not present

## 2017-02-18 DIAGNOSIS — M2010 Hallux valgus (acquired), unspecified foot: Secondary | ICD-10-CM

## 2017-02-18 DIAGNOSIS — Q828 Other specified congenital malformations of skin: Secondary | ICD-10-CM | POA: Diagnosis not present

## 2017-02-18 DIAGNOSIS — M216X9 Other acquired deformities of unspecified foot: Secondary | ICD-10-CM | POA: Diagnosis not present

## 2017-02-18 DIAGNOSIS — M2141 Flat foot [pes planus] (acquired), right foot: Secondary | ICD-10-CM

## 2017-02-18 NOTE — Progress Notes (Signed)
   Subjective:    Patient ID: Amy Hanson, female    DOB: 1986/01/04, 31 y.o.   MRN: [id number]  HPI  31 year old female presents the office today for complaints of pain to the bottoms of both of her feet. She states that she has calluses on the balls of both of her feet which are very painful to pressure and is his been ongoing for several years. She also states that she has a flatfoot and because of that she has a lot of pain when standing all day at work. This has been ongoing for several months. She denies any recent injury or trauma. No numbness or tingling. She's had no recent treatment for this. She has no other complaints today.  Review of Systems  All other systems reviewed and are negative.      Objective:   Physical Exam General: AAO x3, NAD  Dermatological: Thick hyperkeratotic lesions left some metatarsal 2 and right submetatarsal 2/3. Upon debridement there is no underlying ulceration, drainage or any clinical signs of infection. There is no other open lesions or pre-ulcerative lesions identified today.  Vascular: Dorsalis Pedis artery and Posterior Tibial artery pedal pulses are 2/4 bilateral with immedate capillary fill time. Pedal hair growth present. No varicosities and no lower extremity edema present bilateral. There is no pain with calf compression, swelling, warmth, erythema.   Neruologic: Grossly intact via light touch bilateral. Vibratory intact via tuning fork bilateral. Protective threshold with Semmes Wienstein monofilament intact to all pedal sites bilateral.   Musculoskeletal: There is a significant decrease in medial arch height upon weightbearing equinus is present. This prominent metatarsal heads plantarly. During gait evaluation is excessive pronation without any resupination. There is no area of tenderness identified other than the hyperkeratotic lesions. No swelling or redness to the feet. Muscular strength 5/5 in all groups tested bilateral. HAV is present.    Gait: Unassisted, Nonantalgic.      Assessment & Plan:  31 year old female since hyperkeratotic lesions due to biomechanical changes -Treatment options discussed including all alternatives, risks, and complications -Etiology of symptoms were discussed -Lesions are sharply debrided 3 without complications or bleeding. -Given her foot type I discussed custom inserts and she wishes to proceed with these. However we'll check with insurance. She was measured with him today however. I also discussed shoe gear modifications and over-the-counter inserts and we cannot get custom.  -Moisturizer to the feet as well.  Ovid Curd, DPM

## 2017-03-18 ENCOUNTER — Ambulatory Visit: Payer: No Typology Code available for payment source | Admitting: Podiatry

## 2017-03-26 ENCOUNTER — Encounter (HOSPITAL_BASED_OUTPATIENT_CLINIC_OR_DEPARTMENT_OTHER): Payer: Self-pay

## 2017-03-26 ENCOUNTER — Emergency Department (HOSPITAL_BASED_OUTPATIENT_CLINIC_OR_DEPARTMENT_OTHER): Payer: No Typology Code available for payment source

## 2017-03-26 ENCOUNTER — Emergency Department (HOSPITAL_BASED_OUTPATIENT_CLINIC_OR_DEPARTMENT_OTHER)
Admission: EM | Admit: 2017-03-26 | Discharge: 2017-03-26 | Disposition: A | Discharge status: Home / Self Care | Payer: No Typology Code available for payment source | Attending: Emergency Medicine | Admitting: Emergency Medicine

## 2017-03-26 DIAGNOSIS — Z79899 Other long term (current) drug therapy: Secondary | ICD-10-CM | POA: Insufficient documentation

## 2017-03-26 DIAGNOSIS — K5641 Fecal impaction: Secondary | ICD-10-CM

## 2017-03-26 DIAGNOSIS — Z87891 Personal history of nicotine dependence: Secondary | ICD-10-CM | POA: Insufficient documentation

## 2017-03-26 HISTORY — DX: Disorder of thyroid, unspecified: E07.9

## 2017-03-26 LAB — URINALYSIS, ROUTINE W REFLEX MICROSCOPIC
BILIRUBIN URINE: NEGATIVE
Glucose, UA: NEGATIVE mg/dL
Hgb urine dipstick: NEGATIVE
KETONES UR: NEGATIVE mg/dL
Leukocytes, UA: NEGATIVE
Nitrite: NEGATIVE
PH: 6.5 (ref 5.0–8.0)
Protein, ur: NEGATIVE mg/dL
SPECIFIC GRAVITY, URINE: 1.006 (ref 1.005–1.030)

## 2017-03-26 LAB — PREGNANCY, URINE: Preg Test, Ur: NEGATIVE

## 2017-03-26 MED ORDER — FLEET ENEMA 7-19 GM/118ML RE ENEM
1.0000 | ENEMA | Freq: Once | RECTAL | Status: AC
  Administered 2017-03-26: 1 via RECTAL
  Filled 2017-03-26: qty 1

## 2017-03-26 NOTE — ED Notes (Signed)
ED Provider at bedside. 

## 2017-03-26 NOTE — ED Provider Notes (Signed)
[facility]   CSN: [id number] Arrival date & time: 03/26/17  [id number]     History   Chief Complaint Chief Complaint  Patient presents with  . Constipation    HPI Amy Hanson is a 31 y.o. female.  Patient is a 31 year old female with no significant past medical history. She presents for evaluation of constipation. She reports feeling as though she has a hard ball of stool which she cannot pass. She attempted stool softeners, however this has not helped. She does report having a little bleeding from rectum last night. She denies any fevers or chills. She denies abdominal p   The history is provided by the patient.  Constipation   This is a recurrent problem. The current episode started yesterday. The stool is described as blood tinged. She has tried stimulants for the symptoms. The treatment provided no relief.    Past Medical History:  Diagnosis Date  . Breast disorder    lump in right breast/inflammation both breast  . Thyroid disease     There are no active problems to display for this patient.   Past Surgical History:  Procedure Laterality Date  . BREAST SURGERY     chip insertion that lets medical professional know that her breasts have been checked per pt  . CESAREAN SECTION      OB History    Gravida Para Term Preterm AB Living   1 1 1     1    SAB TAB Ectopic Multiple Live Births                   Home Medications    Prior to Admission medications   Medication Sig Start Date End Date Taking? Authorizing Provider  ibuprofen (ADVIL,MOTRIN) 200 MG tablet Take 400 mg by mouth every 6 (six) hours as needed. pain    [provider]  naproxen (NAPROSYN) 500 MG tablet Take 1 tablet (500 mg total) by mouth 2 (two) times daily. 11/29/16   Vanetta MuldersZackowski, Scott, MD    Family History Family History  Problem Relation Age of Onset  . Diabetes Paternal Grandmother   . Hypertension Paternal Grandmother   . Breast cancer Maternal  Grandmother     Social History Social History  Substance Use Topics  . Smoking status: Former Smoker    Packs/day: 0.25    Years: 3.00  . Smokeless tobacco: Never Used  . Alcohol use No     Allergies   Patient has no known allergies.   Review of Systems Review of Systems  Gastrointestinal: Positive for constipation.  All other systems reviewed and are negative.    Physical Exam Updated Vital Signs BP 102/85 (BP Location: Left Arm)   Pulse (!) 106   Temp 97.8 F (36.6 C) (Oral)   Resp 18   Ht 5' (1.524 m)   Wt 57.6 kg (127 lb)   LMP 02/28/2017   SpO2 100%   BMI 24.80 kg/m   Physical Exam  Constitutional: She is oriented to person, place, and time. She appears well-developed and well-nourished. No distress.  Patient appears anxious and is tearful.  HENT:  Head: Normocephalic and atraumatic.  Neck: Normal range of motion. Neck supple.  Cardiovascular: Normal rate and regular rhythm.  Exam reveals no gallop and no friction rub.   No murmur heard. Pulmonary/Chest: Effort normal and breath sounds normal. No respiratory distress. She has no wheezes.  Abdominal: Soft. Bowel sounds are normal. She exhibits no distension. There is no  tenderness.  Genitourinary:  Genitourinary Comments: There is stool present in the rectum. Exam was limited secondary to the patient's discomfort.  Musculoskeletal: Normal range of motion.  Neurological: She is alert and oriented to person, place, and time.  Skin: Skin is warm and dry. She is not diaphoretic.  Nursing note and vitals reviewed.    ED Treatments / Results  Labs (all labs ordered are listed, but only abnormal results are displayed) Labs Reviewed  PREGNANCY, URINE  URINALYSIS, ROUTINE W REFLEX MICROSCOPIC    EKG  EKG Interpretation None       Radiology No results found.  Procedures Procedures (including critical care time)  Medications Ordered in ED Medications - No data to display   Initial Impression  / Assessment and Plan / ED Course  I have reviewed the triage vital signs and the nursing notes.  Pertinent labs & imaging results that were available during my care of the patient were reviewed by me and considered in my medical decision making (see chart for details).  Patient given a fleets enema with good results. Her symptoms have now significantly improved. She will be discharged with magnesium citrate, to return as needed.  Final Clinical Impressions(s) / ED Diagnoses   Final diagnoses:  None    New Prescriptions New Prescriptions   No medications on file     Geoffery Lyons, MD 03/26/17 1035

## 2017-03-26 NOTE — Discharge Instructions (Signed)
Magnesium citrate: Drank the entire 10 ounce bottle mixed with equal parts Sprite or Gatorade for relief of constipation.  Increase the fiber in your diet.  Follow-up with your primary Dr. if you experience additional problems.

## 2017-03-26 NOTE — ED Triage Notes (Signed)
Pt c/o constipation and abdominal cramping.  States last bowel movement was Friday.  Last night she tried taking laxatives and stool softeners and has not had a bowel movement.  Pt states the stool is stuck and is hard, tried to remove some of it herself but started bleeding.

## 2017-07-04 ENCOUNTER — Encounter (HOSPITAL_BASED_OUTPATIENT_CLINIC_OR_DEPARTMENT_OTHER): Payer: Self-pay | Admitting: Emergency Medicine

## 2017-07-04 ENCOUNTER — Encounter (HOSPITAL_BASED_OUTPATIENT_CLINIC_OR_DEPARTMENT_OTHER): Payer: Self-pay | Admitting: *Deleted

## 2017-07-04 ENCOUNTER — Emergency Department (HOSPITAL_BASED_OUTPATIENT_CLINIC_OR_DEPARTMENT_OTHER)
Admission: EM | Admit: 2017-07-04 | Discharge: 2017-07-04 | Disposition: A | Discharge status: Home / Self Care | Payer: PRIVATE HEALTH INSURANCE | Attending: Emergency Medicine | Admitting: Emergency Medicine

## 2017-07-04 ENCOUNTER — Emergency Department (HOSPITAL_BASED_OUTPATIENT_CLINIC_OR_DEPARTMENT_OTHER)
Admission: EM | Admit: 2017-07-04 | Discharge: 2017-07-04 | Disposition: A | Discharge status: Home / Self Care | Payer: No Typology Code available for payment source | Attending: Emergency Medicine | Admitting: Emergency Medicine

## 2017-07-04 DIAGNOSIS — K047 Periapical abscess without sinus: Secondary | ICD-10-CM | POA: Insufficient documentation

## 2017-07-04 DIAGNOSIS — E079 Disorder of thyroid, unspecified: Secondary | ICD-10-CM | POA: Insufficient documentation

## 2017-07-04 DIAGNOSIS — K0889 Other specified disorders of teeth and supporting structures: Secondary | ICD-10-CM | POA: Diagnosis present

## 2017-07-04 DIAGNOSIS — Z87891 Personal history of nicotine dependence: Secondary | ICD-10-CM | POA: Insufficient documentation

## 2017-07-04 DIAGNOSIS — Z5321 Procedure and treatment not carried out due to patient leaving prior to being seen by health care provider: Secondary | ICD-10-CM | POA: Insufficient documentation

## 2017-07-04 MED ORDER — KETOROLAC TROMETHAMINE 30 MG/ML IJ SOLN
15.0000 mg | Freq: Once | INTRAMUSCULAR | Status: DC
  Filled 2017-07-04: qty 1

## 2017-07-04 MED ORDER — HYDROCODONE-ACETAMINOPHEN 5-325 MG PO TABS: 1.0000 | ORAL_TABLET | Freq: Four times a day (QID) | ORAL | 0 refills | Status: DC | PRN

## 2017-07-04 MED ORDER — HYDROCODONE-ACETAMINOPHEN 5-325 MG PO TABS
1.0000 | ORAL_TABLET | Freq: Once | ORAL | Status: AC
  Administered 2017-07-04: 1 via ORAL
  Filled 2017-07-04: qty 1

## 2017-07-04 MED ORDER — BENZOCAINE (TOPICAL) 20 % EX AERO
INHALATION_SPRAY | Freq: Once | CUTANEOUS | Status: AC
  Administered 2017-07-04: 19:00:00 via OROMUCOSAL
  Filled 2017-07-04 (×2): qty 57

## 2017-07-04 MED ORDER — PENICILLIN V POTASSIUM 500 MG PO TABS: 500.0000 mg | ORAL_TABLET | Freq: Four times a day (QID) | ORAL | 0 refills | Status: AC

## 2017-07-04 NOTE — ED Triage Notes (Signed)
She was here earlier for dental pain but left after triage.

## 2017-07-04 NOTE — ED Notes (Signed)
Pt witnessed by registration staff walking to her car and driving out of parking lot.

## 2017-07-04 NOTE — ED Triage Notes (Signed)
Patient states that she has had dental pain x "months" - now she is "spitting" up blood and states that she can see the yellow infection in her mouth.

## 2017-07-04 NOTE — Discharge Instructions (Signed)
Please read instructions below. Take the antibiotic, Penicillin V, 4 times per day until they are gone. Rinse your mouth with warm salt water multiple times per day, especially after meals.  You can take Norco every 6 hours as needed for severe pain. Do not take tylenol, drive or drink alcohol while taking this medication. You can take advil/ibuprofen every 6 hours as needed for moderate pain, in addition to Norco. Schedule an appointment with a dentist, using the dental resource guide attached. Return to the ER for difficulty swallowing or breathing, fever, or new or worsening symptoms.

## 2017-07-04 NOTE — ED Notes (Signed)
Patient is upset about the fact she is getting a "shot" for pain. She reports that she will get a ride home since we are "going to cut my mouth up, you not about to shoot me too"

## 2017-07-04 NOTE — ED Notes (Signed)
Pt verbalizes understanding of d/c instructions and denies any further needs at this time. 

## 2017-07-04 NOTE — ED Provider Notes (Signed)
[facility]   CSN: [id number] Arrival date & time: 07/04/17  1732     History   Chief Complaint Chief Complaint  Patient presents with  . Dental Pain    HPI Amy Hanson is a 31 y.o. female presenting to the ED with acute onset of worsening left lower dental pain that began 3 weeks ago. Patient states pain is not relieved with Tylenol. States she used a Q-tip yesterday and was able to express some pus from the left lower outer gums. States pain radiates into her left face. Denies difficulty breathing or swallowing, fever or chills, nausea or vomiting. She does not have a Education officer, community in town. Reports she does smoke.  The history is provided by the patient.    Past Medical History:  Diagnosis Date  . Breast disorder    lump in right breast/inflammation both breast  . Thyroid disease     There are no active problems to display for this patient.   Past Surgical History:  Procedure Laterality Date  . BREAST SURGERY     chip insertion that lets medical professional know that her breasts have been checked per pt  . CESAREAN SECTION      OB History    Gravida Para Term Preterm AB Living   SAB TAB Ectopic Multiple Live Births                   Home Medications    Prior to Admission medications   Medication Sig Start Date End Date Taking? Authorizing Provider  HYDROcodone-acetaminophen (NORCO/VICODIN) 5-325 MG tablet Take 1-2 tablets by mouth every 6 (six) hours as needed for severe pain. 07/04/17   Russo, [place] N, PA-C  ibuprofen (ADVIL,MOTRIN) 200 MG tablet Take 400 mg by mouth every 6 (six) hours as needed. pain    [provider]  naproxen (NAPROSYN) 500 MG tablet Take 1 tablet (500 mg total) by mouth 2 (two) times daily. 11/29/16   Vanetta Mulders, MD  penicillin v potassium (VEETID) 500 MG tablet Take 1 tablet (500 mg total) by mouth 4 (four) times daily. 07/04/17 07/14/17  Russo, [place] N, PA-C    Family History Family  History  Problem Relation Age of Onset  . Diabetes Paternal Grandmother   . Hypertension Paternal Grandmother   . Breast cancer Maternal Grandmother     Social History Social History  Substance Use Topics  . Smoking status: Former Smoker    Packs/day: 0.25    Years: 3.00  . Smokeless tobacco: Never Used  . Alcohol use No     Allergies   Patient has no known allergies.   Review of Systems Review of Systems  Constitutional: Negative for chills and fever.  HENT: Positive for dental problem. Negative for facial swelling, sore throat, trouble swallowing and voice change.   Respiratory: Negative for shortness of breath and stridor.   Musculoskeletal: Negative for neck pain and neck stiffness.  Allergic/Immunologic: Negative for immunocompromised state.     Physical Exam Updated Vital Signs BP (!) 147/65 (BP Location: Right Arm)   Pulse 100   Temp 98.6 F (37 C) (Oral)   Resp 18   LMP 06/28/2017   SpO2 100%   Physical Exam  Constitutional: She appears well-developed and well-nourished. No distress.  HENT:  Head: Normocephalic and atraumatic.  Mouth/Throat: Uvula is midline and oropharynx is clear and moist. There is trismus in the jaw. Dental abscesses (Area  of fluctuance on the lingual side of the left lower gingiva. Moderate erythema noted.) and uvula swelling present.  Tolerating secretions. No facial edema  Eyes: Conjunctivae are normal.  Neck: Normal range of motion. Neck supple. No tracheal deviation present.  Cardiovascular: Normal rate and intact distal pulses.   Pulmonary/Chest: Effort normal and breath sounds normal. No stridor. No respiratory distress.  Lymphadenopathy:    She has no cervical adenopathy.  Psychiatric: She has a normal mood and affect. Her behavior is normal.  Nursing note and vitals reviewed.    ED Treatments / Results  Labs (all labs ordered are listed, but only abnormal results are displayed) Labs Reviewed - No data to  display  EKG  EKG Interpretation None       Radiology No results found.  Procedures .Marland KitchenIncision and Drainage Date/Time: 07/04/2017 8:09 PM Performed by: RUSSO, [place] N Authorized by: RUSSO, [place] N   Consent:    Consent obtained:  Verbal   Consent given by:  Patient   Risks discussed:  Bleeding, incomplete drainage, infection and pain   Alternatives discussed:  No treatment and alternative treatment Location:    Type:  Abscess   Size:  0.5cm   Location:  Mouth   Mouth location:  Alveolar process Anesthesia (see MAR for exact dosages):    Anesthesia method:  Topical application   Topical anesthesia: benzocaine spray. Procedure type:    Complexity:  Simple Procedure details:    Needle aspiration: no     Incision types:  Stab incision   Incision depth:  Dermal   Scalpel blade:  11   Wound management:  Irrigated with saline   Drainage:  Bloody and purulent   Drainage amount:  Scant   Wound treatment:  Wound left open   Packing materials:  None Post-procedure details:    Patient tolerance of procedure:  Tolerated well, no immediate complications   (including critical care time)  Medications Ordered in ED Medications  benzocaine (HURRICAINE) 20 % oral spray ( Mouth/Throat Given by Other 07/04/17 1919)  HYDROcodone-acetaminophen (NORCO/VICODIN) 5-325 MG per tablet 1 tablet (1 tablet Oral Given 07/04/17 1931)     Initial Impression / Assessment and Plan / ED Course  I have reviewed the triage vital signs and the nursing notes.  Pertinent labs & imaging results that were available during my care of the patient were reviewed by me and considered in my medical decision making (see chart for details).     Patient with dental abscess, amenable to I&D.  VSS, afebrile, tolerating secretions. Exam unconcerning for peritonsillar abscess, Ludwig's angina or spread of infection.  Will treat with penicillin and pain medicine.  Urged patient to follow-up with dentist using dental  resource guide. Pt safe for discharge.  [place] Controlled Substance reporting System queried  Discussed results, findings, treatment and follow up. Patient advised of return precautions. Patient verbalized understanding and agreed with plan.  Final Clinical Impressions(s) / ED Diagnoses   Final diagnoses:  Dental abscess    New Prescriptions Discharge Medication List as of 07/04/2017  7:43 PM    START taking these medications   Details  HYDROcodone-acetaminophen (NORCO/VICODIN) 5-325 MG tablet Take 1-2 tablets by mouth every 6 (six) hours as needed for severe pain., Starting Fri 07/04/2017, Print    penicillin v potassium (VEETID) 500 MG tablet Take 1 tablet (500 mg total) by mouth 4 (four) times daily., Starting Fri 07/04/2017, Until Mon 07/14/2017, Print         Timothy Lasso, [place]  Delrae Sawyers, PA-C 07/04/17 2011    Loren RacerYelverton, David, MD 07/06/17 1718

## 2017-09-22 ENCOUNTER — Encounter (HOSPITAL_COMMUNITY): Payer: Self-pay

## 2017-09-23 ENCOUNTER — Encounter (HOSPITAL_COMMUNITY): Payer: Self-pay | Admitting: *Deleted

## 2018-12-02 ENCOUNTER — Other Ambulatory Visit: Payer: Self-pay

## 2018-12-02 ENCOUNTER — Emergency Department (HOSPITAL_BASED_OUTPATIENT_CLINIC_OR_DEPARTMENT_OTHER): Payer: Managed Care, Other (non HMO)

## 2018-12-02 ENCOUNTER — Emergency Department (HOSPITAL_BASED_OUTPATIENT_CLINIC_OR_DEPARTMENT_OTHER)
Admission: EM | Admit: 2018-12-02 | Discharge: 2018-12-02 | Disposition: A | Discharge status: Home / Self Care | Payer: Managed Care, Other (non HMO) | Attending: Emergency Medicine | Admitting: Emergency Medicine

## 2018-12-02 ENCOUNTER — Encounter (HOSPITAL_BASED_OUTPATIENT_CLINIC_OR_DEPARTMENT_OTHER): Payer: Self-pay

## 2018-12-02 DIAGNOSIS — J069 Acute upper respiratory infection, unspecified: Secondary | ICD-10-CM | POA: Insufficient documentation

## 2018-12-02 DIAGNOSIS — Z87891 Personal history of nicotine dependence: Secondary | ICD-10-CM | POA: Diagnosis not present

## 2018-12-02 DIAGNOSIS — R05 Cough: Secondary | ICD-10-CM | POA: Diagnosis present

## 2018-12-02 MED ORDER — PSEUDOEPHEDRINE HCL 60 MG PO TABS: 60.0000 mg | ORAL_TABLET | ORAL | 0 refills | Status: DC | PRN

## 2018-12-02 NOTE — Discharge Instructions (Signed)
Please read and follow all provided instructions.  Your diagnoses today include:  1. Upper respiratory tract infection, unspecified type     You appear to have an upper respiratory infection (URI). An upper respiratory tract infection, or cold, is a viral infection of the air passages leading to the lungs. It should improve gradually after 5-7 days. You may have a lingering cough that lasts for 2- 4 weeks after the infection.  Tests performed today include:  Vital signs. See below for your results today.   Medications prescribed:   Pseudoephedrine - decongestant medication to help with nasal congestion  Take any prescribed medications only as directed. Treatment for your infection is aimed at treating the symptoms. There are no medications, such as antibiotics, that will cure your infection.   Home care instructions:  Follow any educational materials contained in this packet.   Your illness is contagious and can be spread to others, especially during the first 3 or 4 days. It cannot be cured by antibiotics or other medicines. Take basic precautions such as washing your hands often, covering your mouth when you cough or sneeze, and avoiding public places where you could spread your illness to others.   Please continue drinking plenty of fluids.  Use over-the-counter medicines as needed as directed on packaging for symptom relief.  You may also use ibuprofen or tylenol as directed on packaging for pain or fever.  Do not take multiple medicines containing Tylenol or acetaminophen to avoid taking too much of this medication.  Follow-up instructions: Please follow-up with your primary care provider in the next 3 days for further evaluation of your symptoms if you are not feeling better.   Return instructions:   Please return to the Emergency Department if you experience worsening symptoms.   RETURN IMMEDIATELY IF you develop shortness of breath, confusion or altered mental status, a new  rash, become dizzy, faint, or poorly responsive, or are unable to be cared for at home.  Please return if you have persistent vomiting and cannot keep down fluids or develop a fever that is not controlled by tylenol or motrin.    Please return if you have any other emergent concerns.  Additional Information:  Your vital signs today were: BP 135/85 (BP Location: Right Arm)    Pulse (!) 114    Temp 98.3 F (36.8 C) (Oral)    Resp 18    Ht 4\' 11"  (1.499 m)    Wt 64.4 kg    LMP 11/20/2018    SpO2 100%    BMI 28.68 kg/m  If your blood pressure (BP) was elevated above 135/85 this visit, please have this repeated by your doctor within one month. --------------

## 2018-12-02 NOTE — ED Triage Notes (Signed)
Pp c/o URI and HA sx x 1 week-coughed up blood x 1 today-NAD-steady gait

## 2018-12-02 NOTE — ED Provider Notes (Signed)
[facility] EMERGENCY DEPARTMENT Provider Note   CSN: [id number] Arrival date & time: 12/02/18  1407     History   Chief Complaint Chief Complaint  Patient presents with  . Cough    HPI Amy Hanson is a 33 y.o. female.  Patient presents with 1 day history of nasal congestion, rhinorrhea, and temporal headache.  Patient states that she was spitting up some blood today that she thought was from her teeth, but denies any active toothaches.  She was concerned about seeing blood in about her headaches so she decided to come to the emergency department.  She denies any fever, ear pain.  Nasal congestion worse on the left.  She denies cough to me.  No treatments prior to arrival.  No nausea, vomiting, or diarrhea.  No neurological symptoms.     Past Medical History:  Diagnosis Date  . Breast disorder    lump in right breast/inflammation both breast  . Thyroid disease     There are no active problems to display for this patient.   Past Surgical History:  Procedure Laterality Date  . BREAST SURGERY     chip insertion that lets medical professional know that her breasts have been checked per pt  . CESAREAN SECTION       OB History    Gravida  1   Para  1   Term  1   Preterm      AB      Living  1     SAB      TAB      Ectopic      Multiple      Live Births               Home Medications    Prior to Admission medications   Medication Sig Start Date End Date Taking? Authorizing Provider  ibuprofen (ADVIL,MOTRIN) 200 MG tablet Take 400 mg by mouth every 6 (six) hours as needed. pain    [provider]  naproxen (NAPROSYN) 500 MG tablet Take 1 tablet (500 mg total) by mouth 2 (two) times daily. 11/29/16   Vanetta Mulders, MD  pseudoephedrine (SUDAFED) 60 MG tablet Take 1 tablet (60 mg total) by mouth every 4 (four) hours as needed for congestion. 12/02/18   Renne Crigler, PA-C    Family History Family History  Problem Relation Age of  Onset  . Diabetes Paternal Grandmother   . Hypertension Paternal Grandmother   . Breast cancer Maternal Grandmother     Social History Social History   Tobacco Use  . Smoking status: Former Smoker    Packs/day: 0.25    Years: 3.00    Pack years: 0.75  . Smokeless tobacco: Never Used  Substance Use Topics  . Alcohol use: No    Frequency: Never  . Drug use: Yes    Types: Marijuana     Allergies   Patient has no known allergies.   Review of Systems Review of Systems  Constitutional: Negative for chills, fatigue and fever.  HENT: Positive for congestion, rhinorrhea and sinus pain. Negative for ear pain and sore throat.   Eyes: Negative for redness.  Respiratory: Negative for cough and wheezing.   Gastrointestinal: Negative for abdominal pain, diarrhea, nausea and vomiting.  Genitourinary: Negative for dysuria.  Musculoskeletal: Negative for myalgias and neck stiffness.  Skin: Negative for rash.  Neurological: Positive for headaches.  Hematological: Negative for adenopathy.     Physical Exam Updated Vital Signs BP  135/85 (BP Location: Right Arm)   Pulse (!) 114   Temp 98.3 F (36.8 C) (Oral)   Resp 18   Ht 4\' 11"  (1.499 m)   Wt 64.4 kg   LMP 11/20/2018   SpO2 100%   BMI 28.68 kg/m   Physical Exam Vitals signs and nursing note reviewed.  Constitutional:      Appearance: She is well-developed.  HENT:     Head: Normocephalic and atraumatic.     Jaw: No trismus.     Right Ear: Tympanic membrane, ear canal and external ear normal.     Left Ear: Tympanic membrane, ear canal and external ear normal.     Nose: Nose normal. No mucosal edema or rhinorrhea.     Left Nostril: No epistaxis.     Right Turbinates: Swollen. Not enlarged.     Left Turbinates: Swollen. Not enlarged.     Mouth/Throat:     Mouth: Mucous membranes are not dry. No oral lesions.     Dentition: No dental caries or gum lesions.     Pharynx: Uvula midline. No oropharyngeal exudate, posterior  oropharyngeal erythema or uvula swelling.     Tonsils: No tonsillar abscesses.     Comments: No source of bleeding noted in the pharynx or in the mouth. Eyes:     General:        Right eye: No discharge.        Left eye: No discharge.     Conjunctiva/sclera: Conjunctivae normal.  Neck:     Musculoskeletal: Normal range of motion and neck supple.  Cardiovascular:     Rate and Rhythm: Normal rate and regular rhythm.     Heart sounds: Normal heart sounds.  Pulmonary:     Effort: Pulmonary effort is normal. No respiratory distress.     Breath sounds: Normal breath sounds. No wheezing or rales.  Abdominal:     Palpations: Abdomen is soft.     Tenderness: There is no abdominal tenderness.  Lymphadenopathy:     Cervical: No cervical adenopathy.  Skin:    General: Skin is warm and dry.  Neurological:     Mental Status: She is alert.      ED Treatments / Results  Labs (all labs ordered are listed, but only abnormal results are displayed) Labs Reviewed - No data to display  EKG None  Radiology Dg Chest 2 View  Result Date: 12/02/2018 CLINICAL DATA:  Productive cough and headache for the past week. EXAM: CHEST - 2 VIEW COMPARISON:  Chest x-ray dated November 20, 2009. FINDINGS: The heart size and mediastinal contours are within normal limits. Normal pulmonary vascularity. New subtle small opacity in the right mid lung on the frontal view. No focal consolidation, pleural effusion, or pneumothorax. No acute osseous abnormality. IMPRESSION: 1. New subtle opacity in the right mid lung may represent a small area of infectious or inflammatory alveolitis. Electronically Signed   By: Obie DredgeWilliam T Derry M.D.   On: 12/02/2018 14:50    Procedures Procedures (including critical care time)  Medications Ordered in ED Medications - No data to display   Initial Impression / Assessment and Plan / ED Course  I have reviewed the triage vital signs and the nursing notes.  Pertinent labs & imaging  results that were available during my care of the patient were reviewed by me and considered in my medical decision making (see chart for details).     Patient seen and examined.  She has a viral infection.  Possible early sinusitis causing headache.  She noted a scant amount of blood today and some secretions.  No active bleeding.  She is not coughing per her history.  No chest pain or shortness of breath to suggest PE.  Chest x-ray performed which showed a small area, subtle opacity.  Her clinical presentation is not suggestive of a lower respiratory tract infection.  Vital signs reviewed and are as follows: BP 135/85 (BP Location: Right Arm)   Pulse (!) 114   Temp 98.3 F (36.8 C) (Oral)   Resp 18   Ht 4\' 11"  (1.499 m)   Wt 64.4 kg   LMP 11/20/2018   SpO2 100%   BMI 28.68 kg/m     Final Clinical Impressions(s) / ED Diagnoses   Final diagnoses:  Upper respiratory tract infection, unspecified type   Patient here with URI symptoms including nasal congestion, runny nose, facial pain suggestive of sinus headache.  Patient denies cough to me.  She spit out some blood today likely secretions from nasal passage.  Clinically symptoms not suggestive of PE.  No chest pain or shortness of breath.  Will treat conservatively.  ED Discharge Orders         Ordered    pseudoephedrine (SUDAFED) 60 MG tablet  Every 4 hours PRN     12/02/18 1554           Renne Crigler, PA-C 12/02/18 1558    Little, Ambrose Finland, MD 12/03/18 1820

## 2018-12-02 NOTE — ED Notes (Signed)
ED Provider at bedside. 

## 2019-09-18 ENCOUNTER — Emergency Department (HOSPITAL_BASED_OUTPATIENT_CLINIC_OR_DEPARTMENT_OTHER)
Admission: EM | Admit: 2019-09-18 | Discharge: 2019-09-18 | Disposition: A | Discharge status: Home / Self Care | Payer: Medicaid Other | Attending: Emergency Medicine | Admitting: Emergency Medicine

## 2019-09-18 ENCOUNTER — Encounter: Payer: Self-pay | Admitting: Emergency Medicine

## 2019-09-18 ENCOUNTER — Encounter (HOSPITAL_BASED_OUTPATIENT_CLINIC_OR_DEPARTMENT_OTHER): Payer: Self-pay | Admitting: Emergency Medicine

## 2019-09-18 ENCOUNTER — Other Ambulatory Visit: Payer: Self-pay

## 2019-09-18 ENCOUNTER — Ambulatory Visit
Admission: EM | Admit: 2019-09-18 | Discharge: 2019-09-18 | Disposition: A | Discharge status: Home / Self Care | Payer: Managed Care, Other (non HMO)

## 2019-09-18 DIAGNOSIS — L0231 Cutaneous abscess of buttock: Secondary | ICD-10-CM | POA: Diagnosis present

## 2019-09-18 DIAGNOSIS — Z87891 Personal history of nicotine dependence: Secondary | ICD-10-CM | POA: Insufficient documentation

## 2019-09-18 DIAGNOSIS — E079 Disorder of thyroid, unspecified: Secondary | ICD-10-CM | POA: Insufficient documentation

## 2019-09-18 DIAGNOSIS — K611 Rectal abscess: Secondary | ICD-10-CM

## 2019-09-18 DIAGNOSIS — L0291 Cutaneous abscess, unspecified: Secondary | ICD-10-CM

## 2019-09-18 MED ORDER — LIDOCAINE-EPINEPHRINE (PF) 1 %-1:200000 IJ SOLN
INTRAMUSCULAR | Status: AC
  Filled 2019-09-18: qty 10

## 2019-09-18 MED ORDER — LIDOCAINE HCL (PF) 1 % IJ SOLN
30.0000 mL | Freq: Once | INTRAMUSCULAR | Status: DC
  Filled 2019-09-18: qty 30

## 2019-09-18 MED ORDER — OXYCODONE-ACETAMINOPHEN 5-325 MG PO TABS
1.0000 | ORAL_TABLET | Freq: Once | ORAL | Status: AC
  Administered 2019-09-18: 1 via ORAL
  Filled 2019-09-18: qty 1

## 2019-09-18 NOTE — ED Notes (Signed)
Patient able to ambulate independently  

## 2019-09-18 NOTE — ED Provider Notes (Signed)
[facility]    CSN: [id number] Arrival date & time: 09/18/19  0932      History   Chief Complaint Chief Complaint  Patient presents with  . APPT: 930  . Abscess    HPI Amy Hanson is a 33 y.o. female presenting for gluteal cleft abscess.  Patient endorsing history of perirectal abscess.  Has been trying Epson baths and warm compresses without relief.  States this began around Sunday or Monday and has been worsening consistently since.  Patient denies need for surgical intervention in the past.  Patient having significant pain, especially when sitting, ambulating.   Past Medical History:  Diagnosis Date  . Breast disorder    lump in right breast/inflammation both breast  . Thyroid disease     There are no active problems to display for this patient.   Past Surgical History:  Procedure Laterality Date  . BREAST SURGERY     chip insertion that lets medical professional know that her breasts have been checked per pt  . CESAREAN SECTION      OB History    Gravida  1   Para  1   Term  1   Preterm      AB      Living  1     SAB      TAB      Ectopic      Multiple      Live Births               Home Medications    Prior to Admission medications   Medication Sig Start Date End Date Taking? Authorizing Provider  ibuprofen (ADVIL,MOTRIN) 200 MG tablet Take 400 mg by mouth every 6 (six) hours as needed. pain    [provider]  naproxen (NAPROSYN) 500 MG tablet Take 1 tablet (500 mg total) by mouth 2 (two) times daily. 11/29/16   Vanetta Mulders, MD    Family History Family History  Problem Relation Age of Onset  . Diabetes Paternal Grandmother   . Hypertension Paternal Grandmother   . Breast cancer Maternal Grandmother     Social History Social History   Tobacco Use  . Smoking status: Former Smoker    Packs/day: 0.25    Years: 3.00    Pack years: 0.75  . Smokeless tobacco: Never Used  Substance Use Topics  .  Alcohol use: No    Frequency: Never  . Drug use: Yes    Types: Marijuana     Allergies   Patient has no known allergies.   Review of Systems Review of Systems  Constitutional: Negative for fatigue and fever.  HENT: Negative for ear pain, sinus pain, sore throat and voice change.   Eyes: Negative for pain, redness and visual disturbance.  Respiratory: Negative for cough and shortness of breath.   Cardiovascular: Negative for chest pain and palpitations.  Gastrointestinal: Negative for abdominal pain, diarrhea and vomiting.  Musculoskeletal: Negative for arthralgias and myalgias.  Skin: Positive for wound. Negative for rash.  Neurological: Negative for syncope and headaches.     Physical Exam Triage Vital Signs ED Triage Vitals [09/18/19 0939]  Enc Vitals Group     BP 105/65     Pulse Rate (!) 123     Resp 18     Temp 98.8 F (37.1 C)     Temp Source Oral     SpO2 98 %     Weight      Height  Head Circumference      Peak Flow      Pain Score 10     Pain Loc      Pain Edu?      Excl. in [place]?    No data found.  Updated Vital Signs BP 105/65 (BP Location: Left Arm)   Pulse (!) 123   Temp 98.8 F (37.1 C) (Oral)   Resp 18   LMP 08/28/2019 (Approximate)   SpO2 98%   Visual Acuity Right Eye Distance:   Left Eye Distance:   Bilateral Distance:    Right Eye Near:   Left Eye Near:    Bilateral Near:     Physical Exam Constitutional:      General: She is not in acute distress. HENT:     Head: Normocephalic and atraumatic.  Eyes:     General: No scleral icterus.    Pupils: Pupils are equal, round, and reactive to light.  Cardiovascular:     Rate and Rhythm: Normal rate.  Pulmonary:     Effort: Pulmonary effort is normal.  Skin:      Neurological:     Mental Status: She is alert and oriented to person, place, and time.      UC Treatments / Results  Labs (all labs ordered are listed, but only abnormal results are displayed) Labs Reviewed -  No data to display  EKG   Radiology No results found.  Procedures Procedures (including critical care time)  Medications Ordered in UC Medications - No data to display  Initial Impression / Assessment and Plan / UC Course  I have reviewed the triage vital signs and the nursing notes.  Pertinent labs & imaging results that were available during my care of the patient were reviewed by me and considered in my medical decision making (see chart for details).     Patient afebrile, nontoxic.  Given size, location of abscess in conjunction with tachycardia and hypotension as compared to patient's baseline, this provider is uncomfortable with I&D in office.  Referred to ER for further evaluation/possible imaging if indicated.  Patient verbalized understanding, agreeable to going to ER.  Will self transport with family member who brought her today. Final Clinical Impressions(s) / UC Diagnoses   Final diagnoses:  Abscess of gluteal cleft     Discharge Instructions     Patient referred to ER for further evaluation/management of perirectal abscess given size, tachycardia, hypotension from baseline, and significant pain.    ED Prescriptions    None     PDMP not reviewed this encounter.   Hall-Potvin, [place], [place] 09/19/19 [phone]

## 2019-09-18 NOTE — ED Triage Notes (Signed)
Pt presents to [facility] for assessment of abscess that began Sunday or Monday and has been growing and worsening since.  Patient has tried epsom baths and warm compresses without relief.

## 2019-09-18 NOTE — ED Provider Notes (Signed)
[facility] EMERGENCY DEPARTMENT Provider Note   CSN: [id number] Arrival date & time: 09/18/19  1018     History   Chief Complaint Chief Complaint  Patient presents with  . Abscess    HPI Amy Hanson is a 33 y.o. female.     HPI  Patient is 33 year old previously and otherwise healthy female presented today for abscess of her gluteal region that she first noticed on Sunday which is been painful, worse with sitting, worse to touch, better with ibuprofen, constant, achy, worsening and enlarging since.  Patient states that she has a history of abscesses and had one in her armpit which had to be incised and drained.  States that she had 1 abscess of her gluteal region which improved on its own without drainage.  Denies any fevers, chills, nausea, fatigue, chest pain or shortness of breath.  States she has had normal bowel movements denies any pain of defecation.  Denies any IV drug use, denies any discharge, bleeding.  Past Medical History:  Diagnosis Date  . Breast disorder    lump in right breast/inflammation both breast  . Thyroid disease     There are no active problems to display for this patient.   Past Surgical History:  Procedure Laterality Date  . BREAST SURGERY     chip insertion that lets medical professional know that her breasts have been checked per pt  . CESAREAN SECTION       OB History    Gravida  1   Para  1   Term  1   Preterm      AB      Living  1     SAB      TAB      Ectopic      Multiple      Live Births               Home Medications    Prior to Admission medications   Medication Sig Start Date End Date Taking? Authorizing Provider  ibuprofen (ADVIL,MOTRIN) 200 MG tablet Take 400 mg by mouth every 6 (six) hours as needed. pain    [provider]  naproxen (NAPROSYN) 500 MG tablet Take 1 tablet (500 mg total) by mouth 2 (two) times daily. 11/29/16   Vanetta MuldersZackowski, Scott, MD    Family History Family  History  Problem Relation Age of Onset  . Diabetes Paternal Grandmother   . Hypertension Paternal Grandmother   . Breast cancer Maternal Grandmother     Social History Social History   Tobacco Use  . Smoking status: Former Smoker    Packs/day: 0.25    Years: 3.00    Pack years: 0.75  . Smokeless tobacco: Never Used  Substance Use Topics  . Alcohol use: No    Frequency: Never  . Drug use: Yes    Types: Marijuana     Allergies   Patient has no known allergies.   Review of Systems Review of Systems  Constitutional: Negative for chills and fever.  Respiratory: Negative for cough.   Cardiovascular: Negative for chest pain.  Gastrointestinal: Negative for abdominal pain.  Musculoskeletal: Negative for back pain.  Skin:       Abscess   Neurological: Negative for dizziness.     Physical Exam Updated Vital Signs BP 119/72 (BP Location: Left Arm)   Pulse 98   Temp 97.9 F (36.6 C) (Oral)   Resp 16   LMP 08/28/2019 (Approximate)   SpO2 100%  Physical Exam Vitals signs and nursing note reviewed.  Constitutional:      General: She is not in acute distress.    Appearance: Normal appearance. She is not ill-appearing.  HENT:     Head: Normocephalic and atraumatic.  Eyes:     General: No scleral icterus.       Right eye: No discharge.        Left eye: No discharge.     Conjunctiva/sclera: Conjunctivae normal.  Cardiovascular:     Rate and Rhythm: Tachycardia present.  Pulmonary:     Effort: Pulmonary effort is normal.     Breath sounds: No stridor.  Genitourinary:    Rectum: Normal.     Comments: Rectal exam without abnormality, palpable abscess, pain or unusual stool appearance.  Skin:    Comments: 7 cm abscess with fluctuance located just superior to gluteal cleft. No bleeding or discharge. TTP. Chaperone present at bedside evaluation and rectal exam.   Neurological:     Mental Status: She is alert and oriented to person, place, and time. Mental status is  at baseline.      ED Treatments / Results  Labs (all labs ordered are listed, but only abnormal results are displayed) Labs Reviewed - No data to display  EKG None  Radiology No results found.  Procedures .Marland KitchenIncision and Drainage  Date/Time: 09/18/2019 12:09 PM Performed by: Gailen Shelter, PA Authorized by: Gailen Shelter, PA   Consent:    Consent obtained:  Verbal   Consent given by:  Patient   Risks discussed:  Bleeding, incomplete drainage, pain and damage to other organs   Alternatives discussed:  No treatment Universal protocol:    Procedure explained and questions answered to patient or proxy's satisfaction: yes     Relevant documents present and verified: yes     Test results available and properly labeled: yes     Imaging studies available: yes     Required blood products, implants, devices, and special equipment available: yes     Site/side marked: yes     Immediately prior to procedure a time out was called: yes     Patient identity confirmed:  Verbally with patient Location:    Type:  Abscess   Size:  7 cm   Location:  Anogenital   Anogenital location:  Gluteal cleft Pre-procedure details:    Skin preparation:  Betadine Anesthesia (see MAR for exact dosages):    Anesthesia method:  Local infiltration   Local anesthetic:  Lidocaine 1% WITH epi Procedure type:    Complexity:  Complex Procedure details:    Incision types:  Single straight   Incision depth:  Subcutaneous   Scalpel blade:  11   Wound management:  Probed and deloculated, irrigated with saline and extensive cleaning   Drainage:  Purulent and bloody   Drainage amount:  Copious   Wound treatment:  Wound left open   Packing materials:  1/4 in iodoform gauze   Amount 1/4" iodoform:  6 cm Post-procedure details:    Patient tolerance of procedure:  Tolerated well, no immediate complications   (including critical care time)  Medications Ordered in ED Medications  lidocaine (PF)  (XYLOCAINE) 1 % injection 30 mL (has no administration in time range)  lidocaine-EPINEPHrine (XYLOCAINE-EPINEPHrine) 1 %-1:200000 (PF) injection (has no administration in time range)  oxyCODONE-acetaminophen (PERCOCET/ROXICET) 5-325 MG per tablet 1 tablet (1 tablet Oral Given 09/18/19 1128)     Initial Impression / Assessment and Plan / ED Course  I have reviewed the triage vital signs and the nursing notes.  Pertinent labs & imaging results that were available during my care of the patient were reviewed by me and considered in my medical decision making (see chart for details).        Successful I&D of gluteal cleft abscess with copious drainage and large abscess cavity requiring extensive loculation. Patient has vitals WNL without systemic symptoms. No indication for blood work/cultures to assess further. Patient has no signs/sx of sepsis.   No surrounding cellulitis. No indication for ABX. Patient will FU in 2-3 days for packing change.   Tylenol and ibuprofen for pain.   Wound care instructions given.  Final Clinical Impressions(s) / ED Diagnoses   Final diagnoses:  Abscess  Abscess of buttock, left    ED Discharge Orders    None       Tedd Sias, [place] 09/18/19 1215    Fredia Sorrow, MD 09/19/19 [phone]

## 2019-09-18 NOTE — ED Triage Notes (Signed)
Pt here with intragluteal abscess. Tried at home remedies and didn't help. UC sent her here for [place],.

## 2019-09-18 NOTE — ED Notes (Signed)
ED Provider at bedside. 

## 2019-09-18 NOTE — Discharge Instructions (Signed)
Please have packing changed and wound check in 2-3 days.

## 2019-09-18 NOTE — Discharge Instructions (Addendum)
Patient referred to ER for further evaluation/management of gluteal abscess given size, tachycardia, hypotension from baseline, and significant pain.

## 2019-09-21 ENCOUNTER — Encounter (HOSPITAL_BASED_OUTPATIENT_CLINIC_OR_DEPARTMENT_OTHER): Payer: Self-pay

## 2019-09-21 ENCOUNTER — Other Ambulatory Visit: Payer: Self-pay

## 2019-09-21 ENCOUNTER — Emergency Department (HOSPITAL_COMMUNITY)
Admission: EM | Admit: 2019-09-21 | Discharge: 2019-09-21 | Disposition: A | Discharge status: Home / Self Care | Payer: Medicaid Other | Source: Home / Self Care | Attending: Emergency Medicine | Admitting: Emergency Medicine

## 2019-09-21 ENCOUNTER — Emergency Department (HOSPITAL_BASED_OUTPATIENT_CLINIC_OR_DEPARTMENT_OTHER)
Admission: EM | Admit: 2019-09-21 | Discharge: 2019-09-21 | Discharge status: Against Medical Advice | Payer: Medicaid Other | Attending: Emergency Medicine | Admitting: Emergency Medicine

## 2019-09-21 ENCOUNTER — Encounter (HOSPITAL_COMMUNITY): Payer: Self-pay | Admitting: *Deleted

## 2019-09-21 DIAGNOSIS — Z5189 Encounter for other specified aftercare: Secondary | ICD-10-CM | POA: Insufficient documentation

## 2019-09-21 DIAGNOSIS — Z791 Long term (current) use of non-steroidal anti-inflammatories (NSAID): Secondary | ICD-10-CM | POA: Insufficient documentation

## 2019-09-21 DIAGNOSIS — Z5320 Procedure and treatment not carried out because of patient's decision for unspecified reasons: Secondary | ICD-10-CM | POA: Diagnosis not present

## 2019-09-21 DIAGNOSIS — Z79899 Other long term (current) drug therapy: Secondary | ICD-10-CM | POA: Insufficient documentation

## 2019-09-21 DIAGNOSIS — Z87891 Personal history of nicotine dependence: Secondary | ICD-10-CM | POA: Insufficient documentation

## 2019-09-21 DIAGNOSIS — L0231 Cutaneous abscess of buttock: Secondary | ICD-10-CM | POA: Diagnosis not present

## 2019-09-21 MED ORDER — LIDOCAINE-EPINEPHRINE (PF) 2 %-1:200000 IJ SOLN
20.0000 mL | Freq: Once | INTRAMUSCULAR | Status: DC
  Filled 2019-09-21: qty 20

## 2019-09-21 MED ORDER — OXYCODONE-ACETAMINOPHEN 5-325 MG PO TABS
1.0000 | ORAL_TABLET | Freq: Once | ORAL | Status: AC
  Administered 2019-09-21: 1 via ORAL
  Filled 2019-09-21: qty 1

## 2019-09-21 MED ORDER — LIDOCAINE-EPINEPHRINE (PF) 2 %-1:200000 IJ SOLN
10.0000 mL | Freq: Once | INTRAMUSCULAR | Status: DC
  Filled 2019-09-21: qty 10

## 2019-09-21 MED ORDER — DOXYCYCLINE HYCLATE 100 MG PO CAPS: 100.0000 mg | ORAL_CAPSULE | Freq: Two times a day (BID) | ORAL | 0 refills | Status: AC

## 2019-09-21 MED ORDER — IBUPROFEN 800 MG PO TABS: 800.0000 mg | ORAL_TABLET | Freq: Once | ORAL | Status: DC

## 2019-09-21 MED ORDER — IBUPROFEN 400 MG PO TABS
600.0000 mg | ORAL_TABLET | Freq: Once | ORAL | Status: DC
  Filled 2019-09-21: qty 1

## 2019-09-21 MED ORDER — DOXYCYCLINE HYCLATE 100 MG PO TABS
100.0000 mg | ORAL_TABLET | Freq: Once | ORAL | Status: AC
  Administered 2019-09-21: 100 mg via ORAL
  Filled 2019-09-21: qty 1

## 2019-09-21 MED ORDER — SUCRALFATE 1 G PO TABS: 1.0000 g | ORAL_TABLET | Freq: Three times a day (TID) | ORAL | 0 refills | Status: AC

## 2019-09-21 NOTE — ED Triage Notes (Signed)
Pt was seen on Saturday for I&D of abcess on buttocks.  Packing came out on its own, pt has been changing dressings as instructed. Reports continued drainage to area.  Denies fevers.  Does report some improvement in pain to area.

## 2019-09-21 NOTE — ED Provider Notes (Signed)
[facility] EMERGENCY DEPARTMENT Provider Note   CSN: [id number] Arrival date & time: 09/21/19  1041     History   Chief Complaint Chief Complaint  Patient presents with  . Wound Check    HPI Amy Hanson is a 33 y.o. female presented to the ED for wound recheck.  The patient has a right-sided upper inner gluteal abscess developed several days ago.  She was in her emergency department on 11/21 for I&D, performed at bedside, with subsequent packing of wound.  She was not discharged on antibiotics.  She returns reporting that the packing fell out spontaneously yesterday.  She feels like overall the wound pain is doing better but she feels like the reason is still very tender.  She is reporting some continued small amount of drainage.  NKDA     HPI  Past Medical History:  Diagnosis Date  . Breast disorder    lump in right breast/inflammation both breast  . Thyroid disease     There are no active problems to display for this patient.   Past Surgical History:  Procedure Laterality Date  . BREAST SURGERY     chip insertion that lets medical professional know that her breasts have been checked per pt  . CESAREAN SECTION       OB History    Gravida  1   Para  1   Term  1   Preterm      AB      Living  1     SAB      TAB      Ectopic      Multiple      Live Births               Home Medications    Prior to Admission medications   Medication Sig Start Date End Date Taking? Authorizing Provider  ibuprofen (ADVIL,MOTRIN) 200 MG tablet Take 400 mg by mouth every 6 (six) hours as needed. pain    [provider]  naproxen (NAPROSYN) 500 MG tablet Take 1 tablet (500 mg total) by mouth 2 (two) times daily. 11/29/16   Vanetta Mulders, MD    Family History Family History  Problem Relation Age of Onset  . Diabetes Paternal Grandmother   . Hypertension Paternal Grandmother   . Breast cancer Maternal Grandmother     Social History  Social History   Tobacco Use  . Smoking status: Former Smoker    Packs/day: 0.25    Years: 3.00    Pack years: 0.75  . Smokeless tobacco: Never Used  Substance Use Topics  . Alcohol use: No    Frequency: Never  . Drug use: Yes    Types: Marijuana     Allergies   Patient has no known allergies.   Review of Systems Review of Systems  Constitutional: Negative for chills and fever.  Gastrointestinal: Negative for nausea and vomiting.  Skin: Positive for wound. Negative for color change and rash.  All other systems reviewed and are negative.    Physical Exam Updated Vital Signs BP 107/64 (BP Location: Right Arm)   Pulse (!) 102   Temp 98 F (36.7 C) (Oral)   Resp 18   Ht 5' (1.524 m)   Wt 56.7 kg   LMP 08/28/2019 (Approximate)   SpO2 100%   BMI 24.41 kg/m   Physical Exam Vitals signs and nursing note reviewed.  Constitutional:      General: She is not in acute distress.  Appearance: She is well-developed.  HENT:     Head: Normocephalic and atraumatic.  Eyes:     Conjunctiva/sclera: Conjunctivae normal.  Neck:     Musculoskeletal: Neck supple.  Cardiovascular:     Rate and Rhythm: Normal rate and regular rhythm.  Pulmonary:     Effort: Pulmonary effort is normal. No respiratory distress.  Skin:    General: Skin is warm and dry.     Comments: 4x4 region of warm, induration at upper left buttock near the cleft, fluctuance pocket, prior laceration site is sealed  Neurological:     Mental Status: She is alert.  Psychiatric:        Mood and Affect: Mood normal.        Behavior: Behavior normal.      ED Treatments / Results  Labs (all labs ordered are listed, but only abnormal results are displayed) Labs Reviewed  AEROBIC CULTURE (SUPERFICIAL SPECIMEN)    EKG None  Radiology No results found.  Procedures .Marland KitchenIncision and Drainage  Date/Time: 09/21/2019 11:22 AM Performed by: Wyvonnia Dusky, MD Authorized by: Wyvonnia Dusky, MD    Ultrasound ED Soft Tissue  Date/Time: 09/21/2019 11:22 AM Performed by: Wyvonnia Dusky, MD Authorized by: Wyvonnia Dusky, MD   Procedure details:    Indications: localization of abscess and evaluate for cellulitis     Transverse view:  Visualized   Longitudinal view:  Visualized   Images: archived   Location:    Location: buttocks   Findings:     abscess present    cellulitis present   (including critical care time)  Medications Ordered in ED  Medications  lidocaine-EPINEPHrine (XYLOCAINE W/EPI) 2 %-1:200000 (PF) injection 10 mL (has no administration in time range)  ibuprofen (ADVIL) tablet 600 mg (has no administration in time range)     Initial Impression / Assessment and Plan / ED Course  I have reviewed the triage vital signs and the nursing notes.  Pertinent labs & imaging results that were available during my care of the patient were reviewed by me and considered in my medical decision making (see chart for details).  Clinical Course as of Sep 20 1156  Tue Sep 21, 2019  1155 Patient told nursing staff that she was unhappy with my plan to give her ibuprofen and local lidocaine as pain control for her I&D.  I went back into the room to discuss with the patient, but found that she had left the emergency department.   [MT]    Clinical Course User Index [MT] Wyvonnia Dusky, MD   33 yo female here with recurrent abscess, that was incised and drained 3 days ago.  Unfortunately the packing fell out and the wound appears to have closed again.  On ultrasound it does appear to have a new collection of fluid.  She will need another I&D.  I will obtain a wound culture and also start the patient on antibiotics at this point, given the wound's location.    Final Clinical Impressions(s) / ED Diagnoses   Final diagnoses:  Abscess of buttock, left    ED Discharge Orders    None       Wyvonnia Dusky, MD 09/21/19 1157

## 2019-09-21 NOTE — Discharge Instructions (Signed)
He is return to ED in 2 to 3 days for wound check.  At this time the packing will be removed.  Please take antibiotics as prescribed.  Please take all of your antibiotics until finished!   You may develop abdominal discomfort or diarrhea from the antibiotic.  You may help offset this with probiotics which you can buy or get in yogurt. Do not eat  or take the probiotics until 2 hours after your antibiotic.

## 2019-09-21 NOTE — ED Provider Notes (Addendum)
[facility] DEPT Provider Note   CSN: [id number] Arrival date & time: 09/21/19  1209     History   Chief Complaint No chief complaint on file.   HPI Amy Hanson is a 33 y.o. female.     HPI  Presents for abscess recheck today as she was instructed to return in 2-3 days for packing change and check.  States that pain is considerably improved from prior to incision and drainage.  Patient does state that pain continues to be present with sitting, worse with touch but improves with ibuprofen; incision and drainage done on 11/21 with copious foul purulent drainage.  States abscess packing fell out yesterday.  States that she has continued to have pain in the area but it is improved from original presentation.  Reports that there continues to be drainage from the incision. Denies any fevers, nausea or fatigue.  Patient has history of abscesses in the past one in her armpit and one in the gluteal area.  Has only required I&D once before.   Past Medical History:  Diagnosis Date  . Breast disorder    lump in right breast/inflammation both breast  . Thyroid disease     There are no active problems to display for this patient.   Past Surgical History:  Procedure Laterality Date  . BREAST SURGERY     chip insertion that lets medical professional know that her breasts have been checked per pt  . CESAREAN SECTION       OB History    Gravida  1   Para  1   Term  1   Preterm      AB      Living  1     SAB      TAB      Ectopic      Multiple      Live Births               Home Medications    Prior to Admission medications   Medication Sig Start Date End Date Taking? Authorizing Provider  doxycycline (VIBRAMYCIN) 100 MG capsule Take 1 capsule (100 mg total) by mouth 2 (two) times daily for 7 days. 09/21/19 09/28/19  Tedd Sias, PA  ibuprofen (ADVIL,MOTRIN) 200 MG tablet Take 400 mg by mouth every 6 (six) hours as needed. pain     [provider]  naproxen (NAPROSYN) 500 MG tablet Take 1 tablet (500 mg total) by mouth 2 (two) times daily. 11/29/16   Fredia Sorrow, MD  sucralfate (CARAFATE) 1 g tablet Take 1 tablet (1 g total) by mouth 4 (four) times daily -  with meals and at bedtime for 10 days. 09/21/19 10/01/19  Tedd Sias, PA    Family History Family History  Problem Relation Age of Onset  . Diabetes Paternal Grandmother   . Hypertension Paternal Grandmother   . Breast cancer Maternal Grandmother     Social History Social History   Tobacco Use  . Smoking status: Former Smoker    Packs/day: 0.25    Years: 3.00    Pack years: 0.75  . Smokeless tobacco: Never Used  Substance Use Topics  . Alcohol use: No    Frequency: Never  . Drug use: Yes    Types: Marijuana     Allergies   Patient has no known allergies.   Review of Systems Review of Systems  Constitutional: Negative for chills and fever.  HENT: Negative for congestion.  Eyes: Negative for pain.  Respiratory: Negative for cough and shortness of breath.   Cardiovascular: Negative for chest pain and leg swelling.  Gastrointestinal: Negative for abdominal pain and vomiting.  Musculoskeletal: Negative for myalgias.  Skin: Positive for wound.       Abscess  Neurological: Negative for dizziness and headaches.     Physical Exam Updated Vital Signs BP 118/76 (BP Location: Right Arm)   Pulse (!) 106   Temp 98.2 F (36.8 C) (Oral)   Resp 18   LMP 09/18/2019   SpO2 100%   Physical Exam Vitals signs and nursing note reviewed.  Constitutional:      General: She is not in acute distress.    Appearance: Normal appearance. She is not ill-appearing.  HENT:     Head: Normocephalic and atraumatic.  Eyes:     General: No scleral icterus.       Right eye: No discharge.        Left eye: No discharge.     Conjunctiva/sclera: Conjunctivae normal.  Pulmonary:     Effort: Pulmonary effort is normal.     Breath sounds: No  stridor.  Skin:    Comments: Indurated, 2 cm diameter tender to touch area just superior to the gluteal cleft.  There is no surrounding cellulitis however there is active drainage that is serosanguinous in nature.  No bleeding.  no packing in place.  Neurological:     Mental Status: She is alert and oriented to person, place, and time. Mental status is at baseline.      ED Treatments / Results  Labs (all labs ordered are listed, but only abnormal results are displayed) Labs Reviewed - No data to display  EKG None  Radiology No results found.  Procedures Ultrasound ED Soft Tissue  Date/Time: 09/21/2019 1:44 PM Performed by: Gailen ShelterFondaw, Geovannie Vilar S, PA Authorized by: Gailen ShelterFondaw, Aeralyn Barna S, PA   Procedure details:    Indications: localization of abscess and evaluate for cellulitis     Transverse view:  Visualized   Longitudinal view:  Visualized   Images: not archived   Location:    Location: buttocks     Side:  Midline Findings:     no abscess present    no cellulitis present   (including critical care time)   Medications Ordered in ED Medications  oxyCODONE-acetaminophen (PERCOCET/ROXICET) 5-325 MG per tablet 1 tablet (1 tablet Oral Given 09/21/19 1310)  doxycycline (VIBRA-TABS) tablet 100 mg (100 mg Oral Given 09/21/19 1310)     Initial Impression / Assessment and Plan / ED Course  I have reviewed the triage vital signs and the nursing notes.  Pertinent labs & imaging results that were available during my care of the patient were reviewed by me and considered in my medical decision making (see chart for details).        Patient presents for wound check and packing change. I did the original I&D on 11/21. Patient is much improved. Packing fell out. Bedside US conducted which showed minimal, thin layer of fluid under the skin. Explored incision with forceps and bluntly dissected with kellys--small amount of serosanguinous fluid expressed from old incision. Additional  exploration yield no further fluid/discharge. No fluctuance with reexamination. Repacked incision with gauze and taped gauze to skin with tape. Bandage placed over wound and taped in place. Instructions given to return for wound check in 2-3 days.   Patient has some mild tachycardia. On my exam and on discharge patient pulse was 100.  Discharged patient with doxycyline for MRSA coverage of purulent wound. Given carafate for presumed PUD as patient states she has been having upper abdomen burning pain after eating occasionally.      This patient appears reasonably screened and I doubt any other medical condition requiring further workup, evaluation, or treatment in the ED at this time prior to discharge.   Patient's vitals are WNL apart from vital sign abnormalities discussed above, patient is in NAD, and able to ambulate in the ED at their baseline. Pain has been managed or a plan has been made for home management and has no complaints prior to discharge. Patient is comfortable with above plan and is stable for discharge at this time. All questions were answered prior to disposition. Results from the ER workup discussed with the patient face to face and all questions answered to the best of my ability. The patient is safe for discharge with strict return precautions. Patient appears safe for discharge with appropriate follow-up. Conveyed my impression with the patient and they voiced understanding and are agreeable to plan.   An After Visit Summary was printed and given to the patient.  Portions of this note were generated with Scientist, clinical (histocompatibility and immunogenetics). Dictation errors may occur despite best attempts at proofreading.    Final Clinical Impressions(s) / ED Diagnoses   Final diagnoses:  Abscess re-check    ED Discharge Orders         Ordered    sucralfate (CARAFATE) 1 g tablet  3 times daily with meals & bedtime     09/21/19 1337    doxycycline (VIBRAMYCIN) 100 MG capsule  2 times daily      09/21/19 1337           Gailen Shelter, [place] 09/21/19 2213    Gailen Shelter, [place] 09/21/19 2215    Milagros Loll, MD 09/22/19 [id number]

## 2019-09-21 NOTE — ED Triage Notes (Signed)
Pt states she was at [facility] earlier and was told her abscess on her buttocks needed to be opened and drained.  Pt felt she was not appropriate pain management prior to the procedure.  Pt left and is here after pt's mother told her she can't "walk around with a open wound.

## 2019-09-21 NOTE — ED Notes (Signed)
Wylder, PA made aware of pt's improved but elevated HR of 115.  Pt states she is still nervous.

## 2019-09-21 NOTE — ED Notes (Signed)
Pt refused ibuprofen and lidocaine for I&D, stating she needs stronger medication for procedure.  Dr Langston Masker notified and will speak to pt.  Upon return to room, pt not in room, belongings also gone, assumed to have eloped before MD able to return to room to discuss.

## 2019-09-24 ENCOUNTER — Emergency Department (HOSPITAL_COMMUNITY)
Admission: EM | Admit: 2019-09-24 | Discharge: 2019-09-24 | Disposition: A | Discharge status: Home / Self Care | Payer: Medicaid Other | Attending: Emergency Medicine | Admitting: Emergency Medicine

## 2019-09-24 ENCOUNTER — Encounter (HOSPITAL_COMMUNITY): Payer: Self-pay

## 2019-09-24 ENCOUNTER — Other Ambulatory Visit: Payer: Self-pay

## 2019-09-24 DIAGNOSIS — Z4801 Encounter for change or removal of surgical wound dressing: Secondary | ICD-10-CM | POA: Diagnosis not present

## 2019-09-24 DIAGNOSIS — Z87891 Personal history of nicotine dependence: Secondary | ICD-10-CM | POA: Diagnosis not present

## 2019-09-24 DIAGNOSIS — Z5189 Encounter for other specified aftercare: Secondary | ICD-10-CM

## 2019-09-24 DIAGNOSIS — Z791 Long term (current) use of non-steroidal anti-inflammatories (NSAID): Secondary | ICD-10-CM | POA: Diagnosis not present

## 2019-09-24 NOTE — ED Notes (Signed)
An After Visit Summary was printed and given to the patient. Discharge instructions given and no further questions at this time.  

## 2019-09-24 NOTE — ED Triage Notes (Signed)
Pt had an abscess drained on her buttocks that was drained on Saturday. Pt is here to have the wound checked.

## 2019-09-24 NOTE — ED Notes (Signed)
ED Provider at bedside. 

## 2019-09-24 NOTE — ED Provider Notes (Signed)
[facility]-EMERGENCY DEPT Provider Note   CSN: [id number] Arrival date & time: 09/24/19  1037     History   Chief Complaint Chief Complaint  Patient presents with  . Wound Check    HPI Amy Hanson is a 33 y.o. female.     Patient is a 33 y/o F with no significant PMH presenting to the ER for wound check. Patient had abscess drained on her buttock 11/21. Reports significant improvement in her symptoms. Denies fever, chills. Reports some mild drainage still and that the additional wound packing has fallen out this morning while in the shower.      Past Medical History:  Diagnosis Date  . Breast disorder    lump in right breast/inflammation both breast  . Thyroid disease     There are no active problems to display for this patient.   Past Surgical History:  Procedure Laterality Date  . BREAST SURGERY     chip insertion that lets medical professional know that her breasts have been checked per pt  . CESAREAN SECTION       OB History    Gravida  1   Para  1   Term  1   Preterm      AB      Living  1     SAB      TAB      Ectopic      Multiple      Live Births               Home Medications    Prior to Admission medications   Medication Sig Start Date End Date Taking? Authorizing Provider  doxycycline (VIBRAMYCIN) 100 MG capsule Take 1 capsule (100 mg total) by mouth 2 (two) times daily for 7 days. 09/21/19 09/28/19  Gailen Shelter, PA  ibuprofen (ADVIL,MOTRIN) 200 MG tablet Take 400 mg by mouth every 6 (six) hours as needed. pain    [provider]  naproxen (NAPROSYN) 500 MG tablet Take 1 tablet (500 mg total) by mouth 2 (two) times daily. 11/29/16   Vanetta Mulders, MD  sucralfate (CARAFATE) 1 g tablet Take 1 tablet (1 g total) by mouth 4 (four) times daily -  with meals and at bedtime for 10 days. 09/21/19 10/01/19  Gailen Shelter, PA    Family History Family History  Problem Relation Age of Onset  .  Diabetes Paternal Grandmother   . Hypertension Paternal Grandmother   . Breast cancer Maternal Grandmother     Social History Social History   Tobacco Use  . Smoking status: Former Smoker    Packs/day: 0.25    Years: 3.00    Pack years: 0.75  . Smokeless tobacco: Never Used  Substance Use Topics  . Alcohol use: No    Frequency: Never  . Drug use: Yes    Types: Marijuana     Allergies   Patient has no known allergies.   Review of Systems Review of Systems  Constitutional: Negative for chills and fever.  Respiratory: Negative for cough.   Musculoskeletal: Negative for arthralgias and back pain.  Skin: Positive for wound.  Neurological: Negative for dizziness, light-headedness and headaches.     Physical Exam Updated Vital Signs BP 126/82 (BP Location: Right Arm)   Pulse (!) 107   Temp 98.4 F (36.9 C) (Oral)   Resp 18   LMP 09/18/2019   SpO2 100%   Physical Exam Vitals signs and nursing note reviewed.  Constitutional:      Appearance: Normal appearance.  HENT:     Head: Normocephalic.  Eyes:     Conjunctiva/sclera: Conjunctivae normal.  Pulmonary:     Effort: Pulmonary effort is normal.  Skin:    General: Skin is dry.          Comments: To the left of the gluteal cleft is a small <1cm incision with normal surrounding skin. Mild clear/yellow drainage expressed from the wound. No tenderness or palpable underlying abscess. No erythema.   Neurological:     Mental Status: She is alert.  Psychiatric:        Mood and Affect: Mood normal.      ED Treatments / Results  Labs (all labs ordered are listed, but only abnormal results are displayed) Labs Reviewed - No data to display  EKG None  Radiology No results found.  Procedures Procedures (including critical care time)  Medications Ordered in ED Medications - No data to display   Initial Impression / Assessment and Plan / ED Course  I have reviewed the triage vital signs and the nursing  notes.  Pertinent labs & imaging results that were available during my care of the patient were reviewed by me and considered in my medical decision making (see chart for details).        Based on review of vitals, medical screening exam, lab work and/or imaging, there does not appear to be an acute, emergent etiology for the patient's symptoms. Counseled pt on good return precautions and encouraged both PCP and ED follow-up as needed.  Prior to discharge, I also discussed incidental imaging findings with patient in detail and advised appropriate, recommended follow-up in detail.  Clinical Impression: 1. Visit for wound check     Disposition: Discharge  Prior to providing a prescription for a controlled substance, I independently reviewed the patient's recent prescription history on the [place]. The patient had no recent or regular prescriptions and was deemed appropriate for a brief, less than 3 day prescription of narcotic for acute analgesia.  This note was prepared with assistance of Systems analyst. Occasional wrong-word or sound-a-like substitutions may have occurred due to the inherent limitations of voice recognition software.   Final Clinical Impressions(s) / ED Diagnoses   Final diagnoses:  Visit for wound check    ED Discharge Orders    None       Kristine Royal 09/24/19 1110    Hayden Rasmussen, MD 09/24/19 [phone]

## 2019-09-24 NOTE — Discharge Instructions (Signed)
Your wound appears to be healing well.  You may continue to apply gauze for mild drainage.  You may do warm water soaks or Epsom salt soaks as well.  You may also apply some Vaseline to the wound to help with healing.  Once or twice a day

## 2019-09-28 IMAGING — CR DG CHEST 2V
2 series · 2 of 2 positions shown · non-contrast
Comparison: Chest x-ray dated November 20, 2009.

CLINICAL DATA: Productive cough and headache for the past week.

EXAM:
CHEST - 2 VIEW

[w chest pa]
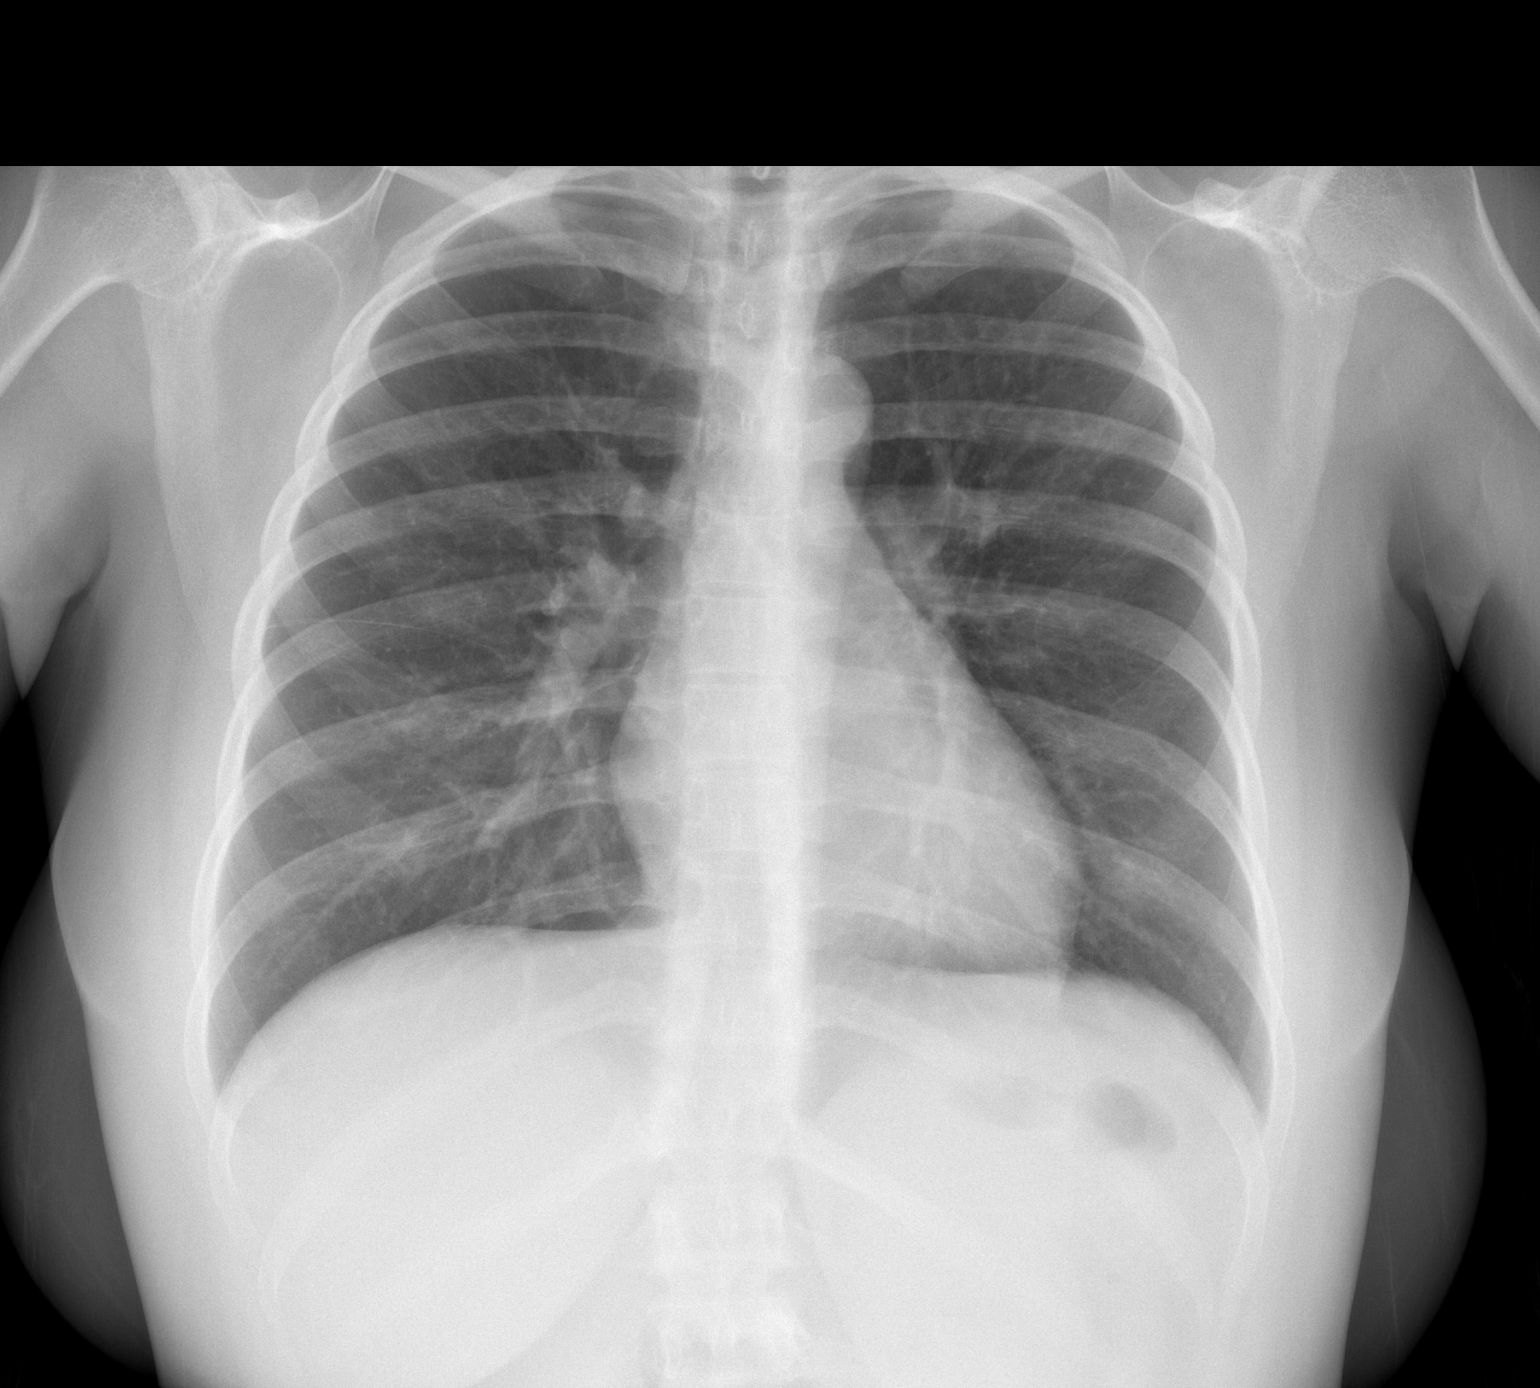

[w chest lat]
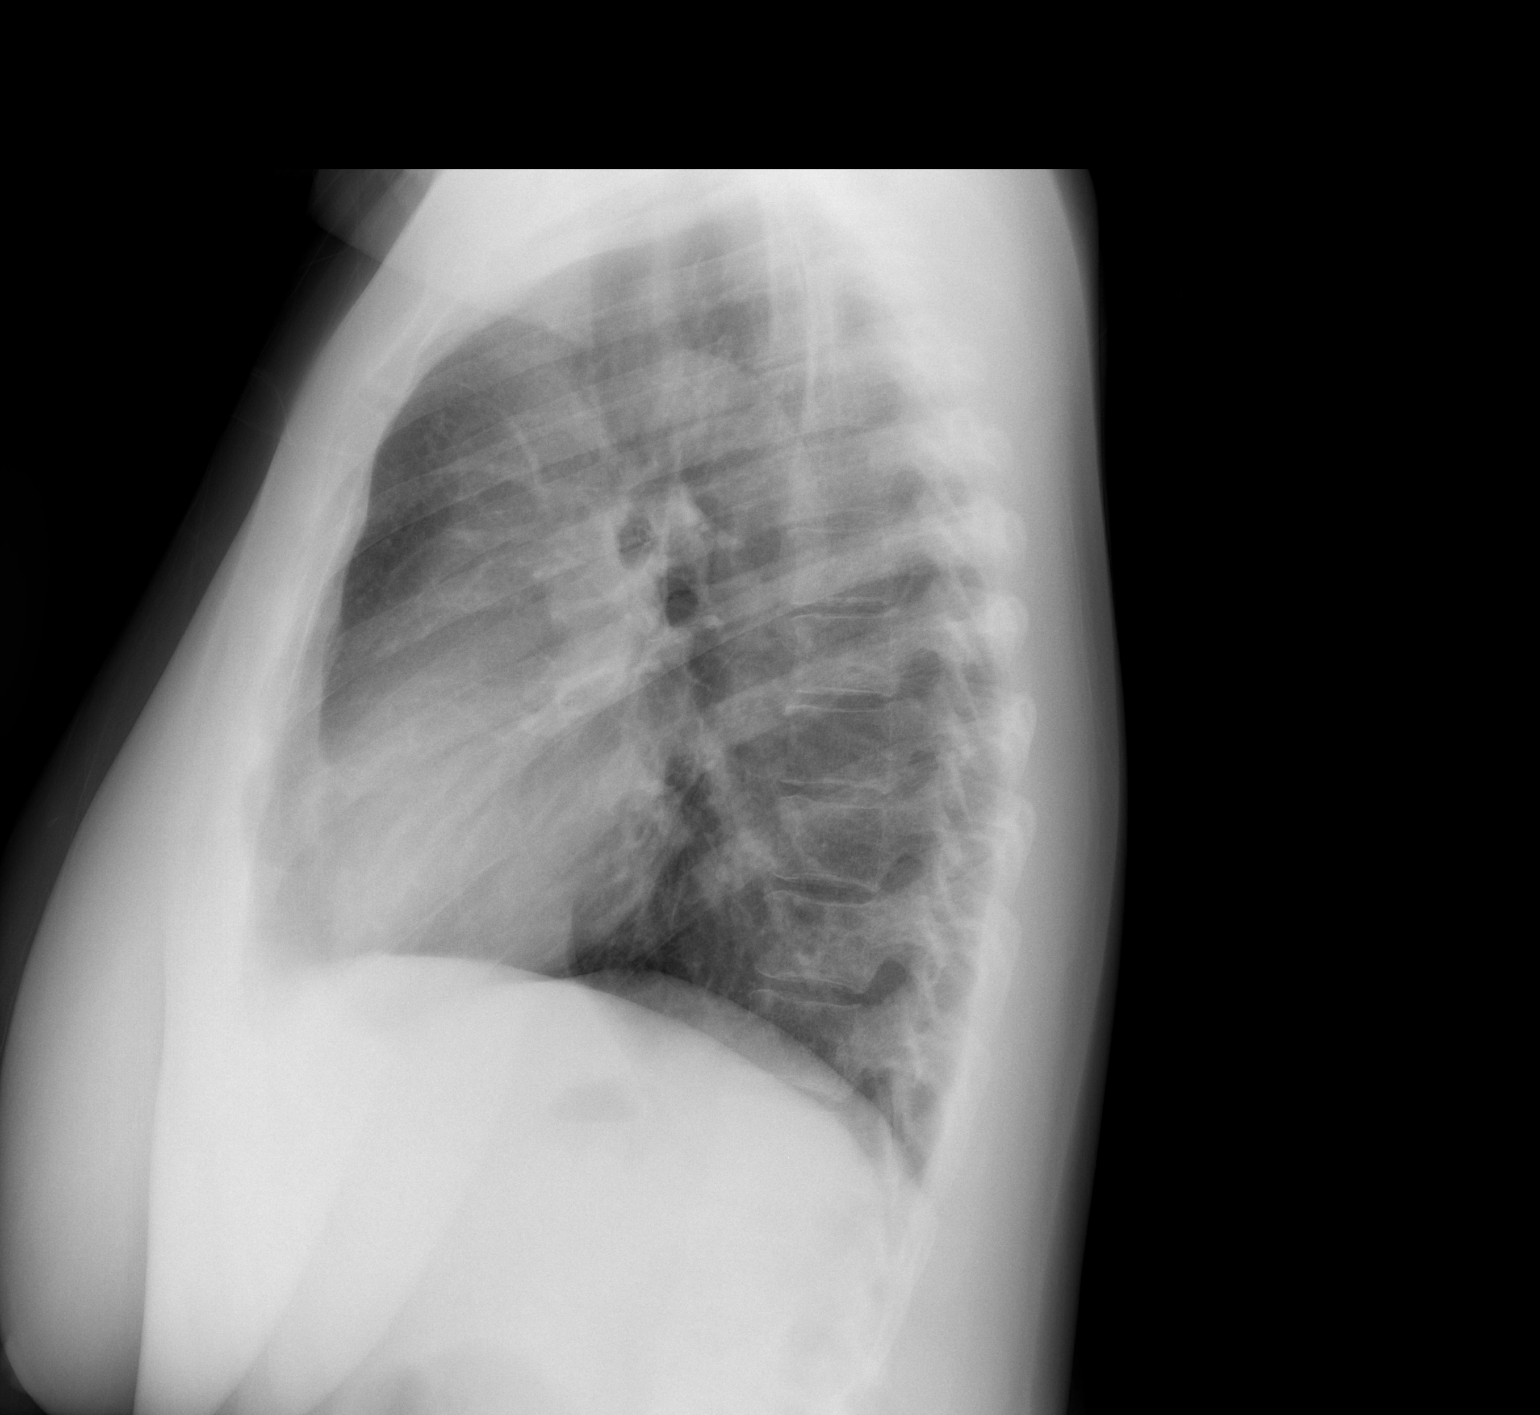

[2 of 2 positions shown; findings below may reference images not displayed]

FINDINGS: The heart size and mediastinal contours are within normal limits.
Normal pulmonary vascularity. New subtle small opacity in the right
mid lung on the frontal view. No focal consolidation, pleural
effusion, or pneumothorax. No acute osseous abnormality.
IMPRESSION: 1. New subtle opacity in the right mid lung may represent a small
area of infectious or inflammatory alveolitis.

## 2022-10-09 ENCOUNTER — Other Ambulatory Visit: Payer: Self-pay

## 2022-10-09 ENCOUNTER — Encounter (HOSPITAL_BASED_OUTPATIENT_CLINIC_OR_DEPARTMENT_OTHER): Payer: Self-pay

## 2022-10-09 ENCOUNTER — Emergency Department (HOSPITAL_BASED_OUTPATIENT_CLINIC_OR_DEPARTMENT_OTHER): Payer: Medicaid Other

## 2022-10-09 ENCOUNTER — Emergency Department (HOSPITAL_BASED_OUTPATIENT_CLINIC_OR_DEPARTMENT_OTHER)
Admission: EM | Admit: 2022-10-09 | Discharge: 2022-10-09 | Disposition: A | Discharge status: Home / Self Care | Payer: Medicaid Other | Attending: Emergency Medicine | Admitting: Emergency Medicine

## 2022-10-09 DIAGNOSIS — R000 Tachycardia, unspecified: Secondary | ICD-10-CM | POA: Insufficient documentation

## 2022-10-09 DIAGNOSIS — R042 Hemoptysis: Secondary | ICD-10-CM | POA: Diagnosis not present

## 2022-10-09 LAB — COMPREHENSIVE METABOLIC PANEL
ALT: 7 U/L (ref 0–44)
AST: 19 U/L (ref 15–41)
Albumin: 3.9 g/dL (ref 3.5–5.0)
Alkaline Phosphatase: 41 U/L (ref 38–126)
Anion gap: 5 (ref 5–15)
BUN: 10 mg/dL (ref 6–20)
CO2: 21 mmol/L — ABNORMAL LOW (ref 22–32)
Calcium: 8.9 mg/dL (ref 8.9–10.3)
Chloride: 111 mmol/L (ref 98–111)
Creatinine, Ser: 0.55 mg/dL (ref 0.44–1.00)
GFR, Estimated: >60 mL/min (ref >60)
Glucose, Bld: 91 mg/dL (ref 70–99)
Potassium: 3.8 mmol/L (ref 3.5–5.1)
Sodium: 137 mmol/L (ref 135–145)
Total Bilirubin: 0.4 mg/dL (ref 0.3–1.2)
Total Protein: 7.6 g/dL (ref 6.5–8.1)

## 2022-10-09 LAB — CBC WITH DIFFERENTIAL/PLATELET
Abs Immature Granulocytes: 0.01 10*3/uL (ref 0.00–0.07)
Basophils Absolute: 0.1 10*3/uL (ref 0.0–0.1)
Basophils Relative: 1 %
Eosinophils Absolute: 0.2 10*3/uL (ref 0.0–0.5)
Eosinophils Relative: 3 %
HCT: 30.1 % — ABNORMAL LOW (ref 36.0–46.0)
Hemoglobin: 9.9 g/dL — ABNORMAL LOW (ref 12.0–15.0)
Immature Granulocytes: 0 %
Lymphocytes Relative: 25 %
Lymphs Abs: 1.6 10*3/uL (ref 0.7–4.0)
MCH: 34.6 pg — ABNORMAL HIGH (ref 26.0–34.0)
MCHC: 32.9 g/dL (ref 30.0–36.0)
MCV: 105.2 fL — ABNORMAL HIGH (ref 80.0–100.0)
Monocytes Absolute: 0.5 10*3/uL (ref 0.1–1.0)
Monocytes Relative: 8 %
Neutro Abs: 4 10*3/uL (ref 1.7–7.7)
Neutrophils Relative %: 63 %
Platelets: 306 10*3/uL (ref 150–400)
RBC: 2.86 MIL/uL — ABNORMAL LOW (ref 3.87–5.11)
RDW: 14.2 % (ref 11.5–15.5)
WBC: 6.4 10*3/uL (ref 4.0–10.5)
nRBC: 0 % (ref 0.0–0.2)

## 2022-10-09 LAB — PROTIME-INR
INR: 1 (ref 0.8–1.2)
Prothrombin Time: 12.6 seconds (ref 11.4–15.2)

## 2022-10-09 MED ORDER — PANTOPRAZOLE SODIUM 40 MG PO TBEC: 40.0000 mg | DELAYED_RELEASE_TABLET | Freq: Every day | ORAL | 0 refills | Status: AC

## 2022-10-09 MED ORDER — SODIUM CHLORIDE 0.9 % IV BOLUS
1000.0000 mL | Freq: Once | INTRAVENOUS | Status: AC
  Administered 2022-10-09: 1000 mL via INTRAVENOUS

## 2022-10-09 NOTE — ED Provider Notes (Signed)
[facility] EMERGENCY DEPARTMENT Provider Note   CSN: [id number] Arrival date & time: 10/09/22  0818     History  Chief Complaint  Patient presents with   Hemoptysis    Comfort Iversen is a 36 y.o. female.  She is here for evaluation of some blood in her mucus.  She said she felt some drainage in the back of her throat and spit up and had some blood in it.  It recurred again a little bit later and caused her to feel nauseous and she was retching and had some more blood.  She is not sure if she vomited the blood or coughed it up or loosen her throat.  She otherwise feels well.  No recent illness.  No sore throat.  She is on her menses now, not having any other bleeding that she has identified.  She does endorse using a lot of ibuprofen and Goody powders.  The history is provided by the patient.  Cough Cough characteristics:  Productive Sputum characteristics:  Bloody Severity:  Moderate Onset quality:  Sudden Timing:  Sporadic Progression:  Unchanged Chronicity:  New Relieved by:  None tried Worsened by:  Nothing Ineffective treatments:  None tried Associated symptoms: no chest pain, no fever, no rhinorrhea, no shortness of breath and no sore throat        Home Medications Prior to Admission medications   Medication Sig Start Date End Date Taking? Authorizing Provider  ibuprofen (ADVIL,MOTRIN) 200 MG tablet Take 400 mg by mouth every 6 (six) hours as needed. pain    [provider]  naproxen (NAPROSYN) 500 MG tablet Take 1 tablet (500 mg total) by mouth 2 (two) times daily. 11/29/16   Vanetta Mulders, MD  sucralfate (CARAFATE) 1 g tablet Take 1 tablet (1 g total) by mouth 4 (four) times daily -  with meals and at bedtime for 10 days. 09/21/19 10/01/19  Gailen Shelter, PA      Allergies    Patient has no known allergies.    Review of Systems   Review of Systems  Constitutional:  Negative for fever.  HENT:  Negative for rhinorrhea and sore throat.   Eyes:   Negative for visual disturbance.  Respiratory:  Positive for cough. Negative for shortness of breath.   Cardiovascular:  Negative for chest pain.  Gastrointestinal:  Negative for abdominal pain.    Physical Exam Updated Vital Signs BP (!) 140/98 (BP Location: Right Arm)   Pulse (!) 130   Temp 98.3 F (36.8 C) (Oral)   Resp 18   Ht 4\' 11"  (1.499 m)   Wt 63.5 kg   LMP 10/08/2022   SpO2 100%   BMI 28.28 kg/m  Physical Exam Vitals and nursing note reviewed.  Constitutional:      General: She is not in acute distress.    Appearance: Normal appearance. She is well-developed.  HENT:     Head: Normocephalic and atraumatic.     Right Ear: Tympanic membrane normal.     Left Ear: Tympanic membrane normal.     Nose: Nose normal.     Mouth/Throat:     Mouth: Mucous membranes are moist.     Pharynx: Oropharynx is clear.  Eyes:     Conjunctiva/sclera: Conjunctivae normal.  Cardiovascular:     Rate and Rhythm: Regular rhythm. Tachycardia present.     Heart sounds: No murmur heard. Pulmonary:     Effort: Pulmonary effort is normal. No respiratory distress.     Breath  sounds: Normal breath sounds.  Abdominal:     Palpations: Abdomen is soft.     Tenderness: There is no abdominal tenderness. There is no guarding or rebound.  Musculoskeletal:     Cervical back: Neck supple.     Right lower leg: No edema.     Left lower leg: No edema.  Skin:    General: Skin is warm and dry.     Capillary Refill: Capillary refill takes less than 2 seconds.  Neurological:     General: No focal deficit present.     Mental Status: She is alert.     ED Results / Procedures / Treatments   Labs (all labs ordered are listed, but only abnormal results are displayed) Labs Reviewed  CBC WITH DIFFERENTIAL/PLATELET - Abnormal; Notable for the following components:      Result Value   RBC 2.86 (*)    Hemoglobin 9.9 (*)    HCT 30.1 (*)    MCV 105.2 (*)    MCH 34.6 (*)    All other components within  normal limits  COMPREHENSIVE METABOLIC PANEL - Abnormal; Notable for the following components:   CO2 21 (*)    All other components within normal limits  PROTIME-INR    EKG None  Radiology DG Chest Port 1 View  Result Date: 10/09/2022 CLINICAL DATA:  Cough.  Hemoptysis. EXAM: PORTABLE CHEST 1 VIEW COMPARISON:  12/02/2018 FINDINGS: The cardiac silhouette, mediastinal and hilar contours are normal. The lungs are clear. No pleural effusions. No pulmonary lesions. No pneumothorax. The bony thorax is intact. IMPRESSION: Normal chest x-ray. Electronically Signed   By: Rudie Meyer M.D.   On: 10/09/2022 09:34    Procedures Procedures    Medications Ordered in ED Medications - No data to display  ED Course/ Medical Decision Making/ A&P Clinical Course as of 10/09/22 1755  Wed Oct 09, 2022  0910 Chest x-ray interpreted by me as no acute infiltrates.  Awaiting radiology reading. [MB]    Clinical Course User Index [MB] Terrilee Files, MD                           Medical Decision Making Amount and/or Complexity of Data Reviewed Labs: ordered. Radiology: ordered.  Risk Prescription drug management.   This patient complains of spitting up blood; this involves an extensive number of treatment Options and is a complaint that carries with it a high risk of complications and morbidity. The differential includes hemoptysis, hematemesis, oropharyngeal blood  I ordered, reviewed and interpreted labs, which included CBC with normal white count, hemoglobin slightly lower than priors, chemistries with mildly low bicarb normal renal function, INR normal I ordered medication IV fluids and reviewed PMP when indicated. I ordered imaging studies which included chest x-ray and I independently    visualized and interpreted imaging which showed no acute findings Previous records obtained and reviewed in epic no recent admissions Cardiac monitoring reviewed, sinus tachycardia improving to  normal sinus rhythm Social determinants considered, no significant barriers Critical Interventions: None  After the interventions stated above, I reevaluated the patient and found patient to be resting comfortably in no distress.  No further bleeding observed here. Admission and further testing considered, no indications for admission or further workup at this time.  Recommended patient observe her symptoms at home and return if any worsening.         Final Clinical Impression(s) / ED Diagnoses Final diagnoses:  Spitting up blood  Rx / DC Orders ED Discharge Orders          Ordered    pantoprazole (PROTONIX) 40 MG tablet  Daily        10/09/22 1101              Terrilee Files, MD 10/09/22 1757

## 2022-10-09 NOTE — ED Triage Notes (Addendum)
States was coughing up blood this morning. Denies abdominal pain, shortness of breath, chest pain. Pt anxious and tearful during triage, tachycardic

## 2022-10-09 NOTE — ED Notes (Signed)
ED Provider at bedside. 

## 2022-10-09 NOTE — Discharge Instructions (Signed)
You were seen in the emergency department for an evaluation of an episode of spitting up blood.  Your chest x-ray was clear and your labs did not show any obvious cause of your symptoms.  You were slightly anemic and this should be followed up with your primary care doctor.  Please limit your use of anti-inflammatories such as ibuprofen or Naprosyn Goody powders.  Return to the emergency department if any worsening or concerning symptoms.

## 2023-03-26 ENCOUNTER — Ambulatory Visit
Admission: EM | Admit: 2023-03-26 | Discharge: 2023-03-26 | Disposition: A | Discharge status: Home / Self Care | Payer: Medicaid Other | Attending: Urgent Care | Admitting: Urgent Care

## 2023-03-26 DIAGNOSIS — N898 Other specified noninflammatory disorders of vagina: Secondary | ICD-10-CM | POA: Insufficient documentation

## 2023-03-26 DIAGNOSIS — R070 Pain in throat: Secondary | ICD-10-CM | POA: Diagnosis present

## 2023-03-26 DIAGNOSIS — Z7251 High risk heterosexual behavior: Secondary | ICD-10-CM | POA: Diagnosis present

## 2023-03-26 DIAGNOSIS — J029 Acute pharyngitis, unspecified: Secondary | ICD-10-CM | POA: Diagnosis present

## 2023-03-26 LAB — POCT RAPID STREP A (OFFICE): Rapid Strep A Screen: NEGATIVE

## 2023-03-26 NOTE — Discharge Instructions (Signed)
We will manage this as a viral illness. For sore throat or cough try using a honey-based tea. Use 3 teaspoons of honey with juice squeezed from half lemon. Place shaved pieces of ginger into 1/2-1 cup of water and warm over stove top. Then mix the ingredients and repeat every 4 hours as needed. Please take ibuprofen 600mg  every 6 hours with food alternating with OR taken together with Tylenol 500mg -650mg  every 6 hours for throat pain, fevers, aches and pains. Hydrate very well with at least 2 liters of water. Eat light meals such as soups (chicken and noodles, vegetable, chicken and wild rice).  Do not eat foods that you are allergic to.  Taking an antihistamine like Zyrtec (10mg  daily) can help against postnasal drainage, sinus congestion which can cause sinus pain, sinus headaches, throat pain, painful swallowing, coughing.  You can take this together with pseudoephedrine (Sudafed) at a dose of 30 mg 3 times a day or twice daily as needed for the same kind of nasal drip, congestion.

## 2023-03-26 NOTE — ED Triage Notes (Signed)
Pt requesting STD screening due to sore throat and possible exposure-NAD-steady gait

## 2023-03-26 NOTE — ED Provider Notes (Signed)
[facility]  Note:  This document was prepared using Conservation officer, historic buildings and may include unintentional dictation errors.  MRN: [id number] DOB: [birth year]  Subjective:   Amy Hanson is a 37 y.o. female presenting for an STI check.  Patient had throat pain a couple of days ago that is now resolved.  Her boyfriend told her that he had pain when he peed.  He is scheduled a visit to be checked for tomorrow.  Patient wants to go ahead and be checked as well.  She does have more vaginal discharge this week but is typical for her when she is about to start her cycle and expects it to come in the next few days.  No urinary symptoms.  No current facility-administered medications for this encounter.  Current Outpatient Medications:    ibuprofen (ADVIL,MOTRIN) 200 MG tablet, Take 400 mg by mouth every 6 (six) hours as needed. pain, Disp: , Rfl:    naproxen (NAPROSYN) 500 MG tablet, Take 1 tablet (500 mg total) by mouth 2 (two) times daily., Disp: 14 tablet, Rfl: 0   pantoprazole (PROTONIX) 40 MG tablet, Take 1 tablet (40 mg total) by mouth daily., Disp: 30 tablet, Rfl: 0   sucralfate (CARAFATE) 1 g tablet, Take 1 tablet (1 g total) by mouth 4 (four) times daily -  with meals and at bedtime for 10 days., Disp: 40 tablet, Rfl: 0   No Known Allergies  Past Medical History:  Diagnosis Date   Breast disorder    lump in right breast/inflammation both breast   Thyroid disease      Past Surgical History:  Procedure Laterality Date   BREAST SURGERY     chip insertion that lets medical professional know that her breasts have been checked per pt   CESAREAN SECTION      Family History  Problem Relation Age of Onset   Diabetes Paternal Grandmother    Hypertension Paternal Grandmother    Breast cancer Maternal Grandmother     Social History   Tobacco Use   Smoking status: Former    Packs/day: 0.25    Years: 3.00    Additional pack years: 0.00    Total  pack years: 0.75    Types: Cigarettes   Smokeless tobacco: Never  Vaping Use   Vaping Use: Never used  Substance Use Topics   Alcohol use: No   Drug use: Yes    Types: Marijuana    ROS   Objective:   Vitals: BP (!) 133/94 (BP Location: Right Arm)   Pulse (!) 106   Temp 99.2 F (37.3 C) (Oral)   Resp 20   LMP 02/28/2023   SpO2 98%   Physical Exam Constitutional:      General: She is not in acute distress.    Appearance: Normal appearance. She is well-developed. She is not ill-appearing, toxic-appearing or diaphoretic.  HENT:     Head: Normocephalic and atraumatic.     Nose: Nose normal.     Mouth/Throat:     Mouth: Mucous membranes are moist.     Pharynx: Oropharynx is clear. No pharyngeal swelling, oropharyngeal exudate, posterior oropharyngeal erythema or uvula swelling.     Tonsils: No tonsillar exudate or tonsillar abscesses. 0 on the right. 0 on the left.  Eyes:     General: No scleral icterus.       Right eye: No discharge.        Left eye: No discharge.  Extraocular Movements: Extraocular movements intact.     Conjunctiva/sclera: Conjunctivae normal.  Cardiovascular:     Rate and Rhythm: Normal rate.  Pulmonary:     Effort: Pulmonary effort is normal.  Abdominal:     General: Bowel sounds are normal. There is no distension.     Palpations: Abdomen is soft. There is no mass.     Tenderness: There is no abdominal tenderness. There is no right CVA tenderness, left CVA tenderness, guarding or rebound.  Skin:    General: Skin is warm and dry.  Neurological:     General: No focal deficit present.     Mental Status: She is alert and oriented to person, place, and time.  Psychiatric:        Mood and Affect: Mood normal.        Behavior: Behavior normal.        Thought Content: Thought content normal.        Judgment: Judgment normal.     Results for orders placed or performed during the hospital encounter of 03/26/23 (from the past 24 hour(s))  POCT  rapid strep A     Status: None   Collection Time: 03/26/23  7:25 PM  Result Value Ref Range   Rapid Strep A Screen Negative Negative    Assessment and Plan :   PDMP not reviewed this encounter.  1. Acute pharyngitis, unspecified etiology   2. Throat pain   3. Vaginal discharge   4. Unprotected sex    Recommended supportive care for acute pharyngitis.  Strep culture pending, vaginal cytology pending.  Advised the patient should get empiric treatment should her boyfriend ends up testing positive for sexually transmitted infection.  Counseled patient on potential for adverse effects with medications prescribed/recommended today, ER and return-to-clinic precautions discussed, patient verbalized understanding.    Wallis Bamberg, [place] 03/26/23 1949

## 2023-03-27 ENCOUNTER — Ambulatory Visit
Admission: EM | Admit: 2023-03-27 | Discharge: 2023-03-27 | Disposition: A | Discharge status: Home / Self Care | Payer: Medicaid Other | Attending: Urgent Care | Admitting: Urgent Care

## 2023-03-27 DIAGNOSIS — A549 Gonococcal infection, unspecified: Secondary | ICD-10-CM

## 2023-03-27 DIAGNOSIS — N760 Acute vaginitis: Secondary | ICD-10-CM

## 2023-03-27 LAB — CERVICOVAGINAL ANCILLARY ONLY
Bacterial Vaginitis (gardnerella): POSITIVE — AB
Candida Glabrata: NEGATIVE
Candida Vaginitis: NEGATIVE
Chlamydia: NEGATIVE
Comment: NEGATIVE
Comment: NEGATIVE
Comment: NEGATIVE
Comment: NEGATIVE
Comment: NEGATIVE
Comment: NORMAL
Neisseria Gonorrhea: POSITIVE — AB
Trichomonas: NEGATIVE

## 2023-03-27 MED ORDER — METRONIDAZOLE 500 MG PO TABS: 500.0000 mg | ORAL_TABLET | Freq: Two times a day (BID) | ORAL | 0 refills | Status: DC

## 2023-03-27 MED ORDER — CEFTRIAXONE SODIUM 500 MG IJ SOLR
500.0000 mg | INTRAMUSCULAR | Status: DC
  Administered 2023-03-27: 500 mg via INTRAMUSCULAR

## 2023-03-27 NOTE — ED Provider Notes (Signed)
Patient needed a nurse visit and review of her labs.  IM ceftriaxone administered in clinic of 500 mg.  She also tested positive for bacterial vaginosis and will need Flagyl for this.   Wallis Bamberg, [place] 03/27/23 1945

## 2023-03-27 NOTE — ED Triage Notes (Signed)
Pt presents for +STD-NAD-steady gait

## 2023-03-29 LAB — CULTURE, GROUP A STREP (THRC)

## 2023-06-08 ENCOUNTER — Ambulatory Visit
Admission: EM | Admit: 2023-06-08 | Discharge: 2023-06-08 | Disposition: A | Discharge status: Home / Self Care | Payer: Medicaid Other | Attending: Internal Medicine | Admitting: Internal Medicine

## 2023-06-08 DIAGNOSIS — Z113 Encounter for screening for infections with a predominantly sexual mode of transmission: Secondary | ICD-10-CM | POA: Diagnosis present

## 2023-06-08 NOTE — Discharge Instructions (Signed)
The clinic will contact you with results of the testing done today if positive

## 2023-06-08 NOTE — ED Provider Notes (Signed)
[facility]    CSN: [id number] Arrival date & time: 06/08/23  0818      History   Chief Complaint No chief complaint on file.   HPI Amy Hanson is a 37 y.o. female presents for evaluation of STD testing.  Patient currently denies any symptoms including dysuria, vaginal discharge, or STD exposure.  She was treated for BV and gonorrhea in May with complete resolution of symptoms.  No other concerns at this time.  HPI  Past Medical History:  Diagnosis Date   Breast disorder    lump in right breast/inflammation both breast   Thyroid disease     There are no problems to display for this patient.   Past Surgical History:  Procedure Laterality Date   BREAST SURGERY     chip insertion that lets medical professional know that her breasts have been checked per pt   CESAREAN SECTION      OB History     Gravida  1   Para  1   Term  1   Preterm      AB      Living  1      SAB      IAB      Ectopic      Multiple      Live Births               Home Medications    Prior to Admission medications   Medication Sig Start Date End Date Taking? Authorizing Provider  ibuprofen (ADVIL,MOTRIN) 200 MG tablet Take 400 mg by mouth every 6 (six) hours as needed. pain    [provider]  metroNIDAZOLE (FLAGYL) 500 MG tablet Take 1 tablet (500 mg total) by mouth 2 (two) times daily with a meal. DO NOT CONSUME ALCOHOL WHILE TAKING THIS MEDICATION. 03/27/23   Wallis Bamberg, PA-C  naproxen (NAPROSYN) 500 MG tablet Take 1 tablet (500 mg total) by mouth 2 (two) times daily. 11/29/16   Vanetta Mulders, MD  pantoprazole (PROTONIX) 40 MG tablet Take 1 tablet (40 mg total) by mouth daily. 10/09/22   Terrilee Files, MD  sucralfate (CARAFATE) 1 g tablet Take 1 tablet (1 g total) by mouth 4 (four) times daily -  with meals and at bedtime for 10 days. 09/21/19 10/01/19  Gailen Shelter, PA    Family History Family History  Problem Relation Age of Onset    Diabetes Paternal Grandmother    Hypertension Paternal Grandmother    Breast cancer Maternal Grandmother     Social History Social History   Tobacco Use   Smoking status: Former    Current packs/day: 0.25    Average packs/day: 0.3 packs/day for 3.0 years (0.8 ttl pk-yrs)    Types: Cigarettes   Smokeless tobacco: Never  Vaping Use   Vaping status: Never Used  Substance Use Topics   Alcohol use: No   Drug use: Yes    Types: Marijuana     Allergies   Patient has no known allergies.   Review of Systems Review of Systems  Genitourinary:        STD screening     Physical Exam Triage Vital Signs ED Triage Vitals  Encounter Vitals Group     BP 06/08/23 0827 119/78     Systolic BP Percentile --      Diastolic BP Percentile --      Pulse Rate 06/08/23 0827 98     Resp 06/08/23 0827 16  Temp 06/08/23 0827 98.9 F (37.2 C)     Temp Source 06/08/23 0827 Oral     SpO2 06/08/23 0827 99 %     Weight --      Height --      Head Circumference --      Peak Flow --      Pain Score 06/08/23 0832 0     Pain Loc --      Pain Education --      Exclude from Growth Chart --    No data found.  Updated Vital Signs BP 119/78 (BP Location: Right Arm)   Pulse 98   Temp 98.9 F (37.2 C) (Oral)   Resp 16   LMP 05/24/2023 (Approximate)   SpO2 99%   Visual Acuity Right Eye Distance:   Left Eye Distance:   Bilateral Distance:    Right Eye Near:   Left Eye Near:    Bilateral Near:     Physical Exam Vitals and nursing note reviewed.  Constitutional:      Appearance: Normal appearance.  HENT:     Head: Normocephalic and atraumatic.  Eyes:     Pupils: Pupils are equal, round, and reactive to light.  Cardiovascular:     Rate and Rhythm: Normal rate.  Pulmonary:     Effort: Pulmonary effort is normal.  Abdominal:     Tenderness: There is no right CVA tenderness or left CVA tenderness.  Skin:    General: Skin is warm and dry.  Neurological:     General: No focal  deficit present.     Mental Status: She is alert and oriented to person, place, and time.  Psychiatric:        Mood and Affect: Mood normal.        Behavior: Behavior normal.      UC Treatments / Results  Labs (all labs ordered are listed, but only abnormal results are displayed) Labs Reviewed  RPR  HIV ANTIBODY (ROUTINE TESTING W REFLEX)  CERVICOVAGINAL ANCILLARY ONLY    EKG   Radiology No results found.  Procedures Procedures (including critical care time)  Medications Ordered in UC Medications - No data to display  Initial Impression / Assessment and Plan / UC Course  I have reviewed the triage vital signs and the nursing notes.  Pertinent labs & imaging results that were available during my care of the patient were reviewed by me and considered in my medical decision making (see chart for details).     STD testing is ordered and will contact for any positive results.  Follow-up as needed. Final Clinical Impressions(s) / UC Diagnoses   Final diagnoses:  Screening examination for STD (sexually transmitted disease)     Discharge Instructions      The clinic will contact you with results of the testing done today if positive.    ED Prescriptions   None    PDMP not reviewed this encounter.   Radford Pax, NP 06/08/23 [phone]

## 2023-06-08 NOTE — ED Triage Notes (Signed)
Pt presents to UC for std check. No symptoms. No known exposure. Pt states two months ago she tested positive for BV and gonorrhea and wanted to get checked.

## 2023-06-11 ENCOUNTER — Telehealth (HOSPITAL_COMMUNITY): Payer: Self-pay | Admitting: Emergency Medicine

## 2023-06-11 MED ORDER — METRONIDAZOLE 500 MG PO TABS: 500.0000 mg | ORAL_TABLET | Freq: Two times a day (BID) | ORAL | 0 refills | Status: AC

## 2023-06-11 NOTE — Telephone Encounter (Signed)
Metronidazole for BV

## 2024-09-24 ENCOUNTER — Ambulatory Visit: Admission: EM | Admit: 2024-09-24 | Discharge: 2024-09-24 | Disposition: A | Discharge status: Home / Self Care

## 2024-09-24 DIAGNOSIS — R590 Localized enlarged lymph nodes: Secondary | ICD-10-CM | POA: Diagnosis present

## 2024-09-24 LAB — POCT URINE DIPSTICK
Bilirubin, UA: NEGATIVE
Glucose, UA: NEGATIVE mg/dL
Ketones, POC UA: NEGATIVE mg/dL
Leukocytes, UA: NEGATIVE
Nitrite, UA: NEGATIVE
POC PROTEIN,UA: 30 — AB
Spec Grav, UA: >=1.03 — AB (ref 1.010–1.025)
Urobilinogen, UA: 0.2 U/dL
pH, UA: 6 (ref 5.0–8.0)

## 2024-09-24 LAB — POCT URINE PREGNANCY: Preg Test, Ur: NEGATIVE

## 2024-09-24 NOTE — ED Provider Notes (Signed)
 [facility]  Note:  This document was prepared using Dragon voice recognition software and may include unintentional dictation errors.  MRN: [id number] DOB: [birth year]  Subjective:   Amy Hanson is a 38 y.o. female presenting for evaluation of left inguinal pain and swelling that she noticed 2 days ago.  Patient reports that she woke up yesterday morning with mild swelling and discomfort to the left groin.  Patient denies any known injury or trauma or any other secondary symptoms.  Patient was concern for lymph node swelling.  Denies any dysuria, increased urinary frequency, vaginal discharge, vaginal lesion, concern for any STD or urinary infection.  No current facility-administered medications for this encounter.  Current Outpatient Medications:    ibuprofen  (ADVIL ,MOTRIN ) 200 MG tablet, Take 400 mg by mouth every 6 (six) hours as needed. pain, Disp: , Rfl:    metroNIDAZOLE  (FLAGYL ) 500 MG tablet, Take 1 tablet (500 mg total) by mouth 2 (two) times daily., Disp: 14 tablet, Rfl: 0   naproxen  (NAPROSYN ) 500 MG tablet, Take 1 tablet (500 mg total) by mouth 2 (two) times daily., Disp: 14 tablet, Rfl: 0   pantoprazole  (PROTONIX ) 40 MG tablet, Take 1 tablet (40 mg total) by mouth daily., Disp: 30 tablet, Rfl: 0   sucralfate  (CARAFATE ) 1 g tablet, Take 1 tablet (1 g total) by mouth 4 (four) times daily -  with meals and at bedtime for 10 days., Disp: 40 tablet, Rfl: 0   No Known Allergies  Past Medical History:  Diagnosis Date   Breast disorder    lump in right breast/inflammation both breast   Thyroid disease      Past Surgical History:  Procedure Laterality Date   BREAST SURGERY     chip insertion that lets medical professional know that her breasts have been checked per pt   CESAREAN SECTION      Family History  Problem Relation Age of Onset   Diabetes Paternal Grandmother    Hypertension Paternal Grandmother    Breast cancer Maternal Grandmother     Social  History   Tobacco Use   Smoking status: Former    Current packs/day: 0.25    Average packs/day: 0.3 packs/day for 3.0 years (0.8 ttl pk-yrs)    Types: Cigarettes   Smokeless tobacco: Never  Vaping Use   Vaping status: Never Used  Substance Use Topics   Alcohol use: No   Drug use: Yes    Types: Marijuana    ROS Refer to HPI for ROS details.  Objective:    Vitals: BP 116/71 (BP Location: Right Arm)   Pulse (!) 109   Temp 98.9 F (37.2 C) (Oral)   Resp 19   Ht 4' 11 (1.499 m)   Wt 130 lb (59 kg)   LMP 09/20/2024   SpO2 98%   BMI 26.26 kg/m   Physical Exam Vitals and nursing note reviewed.  Constitutional:      General: She is not in acute distress.    Appearance: Normal appearance. She is well-developed. She is not ill-appearing or toxic-appearing.  HENT:     Head: Normocephalic and atraumatic.     Nose: Nose normal.     Mouth/Throat:     Mouth: Mucous membranes are moist.     Pharynx: Oropharynx is clear.  Cardiovascular:     Rate and Rhythm: Normal rate.  Pulmonary:     Effort: Pulmonary effort is normal. No respiratory distress.  Lymphadenopathy:     Lower Body: Left inguinal adenopathy present.  Skin:    General: Skin is warm and dry.  Neurological:     General: No focal deficit present.     Mental Status: She is alert and oriented to person, place, and time.  Psychiatric:        Mood and Affect: Mood normal.        Behavior: Behavior normal.     Procedures  Results for orders placed or performed during the hospital encounter of 09/24/24 (from the past 24 hours)  POCT URINE DIPSTICK     Status: Abnormal   Collection Time: 09/24/24 12:35 PM  Result Value Ref Range   Color, UA yellow yellow   Clarity, UA clear clear   Glucose, UA negative negative mg/dL   Bilirubin, UA negative negative   Ketones, POC UA negative negative mg/dL   Spec Grav, UA >=8.969 (A) 1.010 - 1.025   Blood, UA moderate (A) negative   pH, UA 6.0 5.0 - 8.0   POC  PROTEIN,UA =30 (A) negative, trace   Urobilinogen, UA 0.2 0.2 or 1.0 E.U./dL   Nitrite, UA Negative Negative   Leukocytes, UA Negative Negative  POCT urine pregnancy     Status: None   Collection Time: 09/24/24 12:38 PM  Result Value Ref Range   Preg Test, Ur Negative Negative    Assessment and Plan :     Discharge Instructions       1. Inguinal lymphadenopathy (Primary) - POCT URINE DIPSTICK completed in UC shows moderate blood, no leukocytes, no nitrite, no significant sign of urinary infection, moderate blood is most likely secondary to residual bleeding from menstrual cycle. - POCT urine pregnancy completed in UC is negative - Cervicovaginal collected in UC and sent to lab for further testing results should be available in 2 to 3 days.  If there is any abnormal findings patient will be contacted and appropriate treatment provided - Serum blood tests for CBC and CMP collected in UC and sent to lab for further testing results should be available in 1 to 2 days.  These tests are just to make sure that there is no subclinical infection that may be causing abnormal lymph node enlargement.  -Continue to monitor symptoms for any change in severity if there is any escalation of current symptoms or development of new symptoms follow-up in ER for further evaluation and management.       Amy Hanson Amy Hanson   Amy Hanson, Elizabeth B, NP 09/24/24 1324

## 2024-09-24 NOTE — Discharge Instructions (Addendum)
  1. Inguinal lymphadenopathy (Primary) - POCT URINE DIPSTICK completed in UC shows moderate blood, no leukocytes, no nitrite, no significant sign of urinary infection, moderate blood is most likely secondary to residual bleeding from menstrual cycle. - POCT urine pregnancy completed in UC is negative - Cervicovaginal collected in UC and sent to lab for further testing results should be available in 2 to 3 days.  If there is any abnormal findings patient will be contacted and appropriate treatment provided - Serum blood tests for CBC and CMP collected in UC and sent to lab for further testing results should be available in 1 to 2 days.  These tests are just to make sure that there is no subclinical infection that may be causing abnormal lymph node enlargement.  -Continue to monitor symptoms for any change in severity if there is any escalation of current symptoms or development of new symptoms follow-up in ER for further evaluation and management.

## 2024-09-24 NOTE — ED Triage Notes (Signed)
 Pt states that she has some swelling on the left side of her groin area. X2 days

## 2024-09-25 LAB — CBC WITH DIFFERENTIAL/PLATELET
Basophils Absolute: 0.1 x10E3/uL (ref 0.0–0.2)
Basos: 1 %
EOS (ABSOLUTE): 0.2 x10E3/uL (ref 0.0–0.4)
Eos: 2 %
Hematocrit: 26.8 % — ABNORMAL LOW (ref 34.0–46.6)
Hemoglobin: 9 g/dL — ABNORMAL LOW (ref 11.1–15.9)
Immature Grans (Abs): 0 x10E3/uL (ref 0.0–0.1)
Immature Granulocytes: 0 %
Lymphocytes Absolute: 2.1 x10E3/uL (ref 0.7–3.1)
Lymphs: 25 %
MCH: 35 pg — ABNORMAL HIGH (ref 26.6–33.0)
MCHC: 33.6 g/dL (ref 31.5–35.7)
MCV: 104 fL — ABNORMAL HIGH (ref 79–97)
Monocytes Absolute: 0.7 x10E3/uL (ref 0.1–0.9)
Monocytes: 8 %
Neutrophils Absolute: 5.4 x10E3/uL (ref 1.4–7.0)
Neutrophils: 64 %
Platelets: 353 x10E3/uL (ref 150–450)
RBC: 2.57 x10E6/uL — CL (ref 3.77–5.28)
RDW: 14.4 % (ref 11.7–15.4)
WBC: 8.5 x10E3/uL (ref 3.4–10.8)

## 2024-09-25 LAB — COMPREHENSIVE METABOLIC PANEL WITH GFR
ALT: 3 IU/L (ref 0–32)
AST: 9 IU/L (ref 0–40)
Albumin: 4.3 g/dL (ref 3.9–4.9)
Alkaline Phosphatase: 46 IU/L (ref 41–116)
BUN/Creatinine Ratio: 20 (ref 9–23)
BUN: 13 mg/dL (ref 6–20)
Bilirubin Total: <0.2 mg/dL (ref 0.0–1.2)
CO2: 19 mmol/L — ABNORMAL LOW (ref 20–29)
Calcium: 9.3 mg/dL (ref 8.7–10.2)
Chloride: 108 mmol/L — ABNORMAL HIGH (ref 96–106)
Creatinine, Ser: 0.64 mg/dL (ref 0.57–1.00)
Globulin, Total: 2.8 g/dL (ref 1.5–4.5)
Glucose: 90 mg/dL (ref 70–99)
Potassium: 3.8 mmol/L (ref 3.5–5.2)
Sodium: 137 mmol/L (ref 134–144)
Total Protein: 7.1 g/dL (ref 6.0–8.5)
eGFR: 116 mL/min/1.73 (ref >59)

## 2024-09-27 ENCOUNTER — Ambulatory Visit (HOSPITAL_COMMUNITY): Payer: Self-pay

## 2024-09-27 LAB — CERVICOVAGINAL ANCILLARY ONLY
Bacterial Vaginitis (gardnerella): POSITIVE — AB
Candida Glabrata: NEGATIVE
Candida Vaginitis: NEGATIVE
Chlamydia: NEGATIVE
Comment: NEGATIVE
Comment: NEGATIVE
Comment: NEGATIVE
Comment: NEGATIVE
Comment: NEGATIVE
Comment: NORMAL
Neisseria Gonorrhea: NEGATIVE
Trichomonas: NEGATIVE

## 2024-09-28 MED ORDER — METRONIDAZOLE 500 MG PO TABS: 500.0000 mg | ORAL_TABLET | Freq: Two times a day (BID) | ORAL | 0 refills | Status: AC
